# Patient Record
Sex: Female | Born: 1965 | Race: White | Hispanic: No | Marital: Married | Smoking: Current every day smoker
Health system: Southern US, Community
[De-identification: ages and names within clinical notes are randomized; demographics above are authoritative.]

## PROBLEM LIST (undated history)

## (undated) DIAGNOSIS — F419 Anxiety disorder, unspecified: Secondary | ICD-10-CM

## (undated) DIAGNOSIS — K7030 Alcoholic cirrhosis of liver without ascites: Secondary | ICD-10-CM

## (undated) DIAGNOSIS — R188 Other ascites: Secondary | ICD-10-CM

## (undated) DIAGNOSIS — K219 Gastro-esophageal reflux disease without esophagitis: Secondary | ICD-10-CM

## (undated) DIAGNOSIS — I10 Essential (primary) hypertension: Secondary | ICD-10-CM

## (undated) DIAGNOSIS — F329 Major depressive disorder, single episode, unspecified: Secondary | ICD-10-CM

## (undated) DIAGNOSIS — R160 Hepatomegaly, not elsewhere classified: Secondary | ICD-10-CM

## (undated) DIAGNOSIS — F32A Depression, unspecified: Secondary | ICD-10-CM

## (undated) DIAGNOSIS — D696 Thrombocytopenia, unspecified: Secondary | ICD-10-CM

## (undated) DIAGNOSIS — A6000 Herpesviral infection of urogenital system, unspecified: Secondary | ICD-10-CM

## (undated) DIAGNOSIS — E7800 Pure hypercholesterolemia, unspecified: Secondary | ICD-10-CM

## (undated) DIAGNOSIS — F1010 Alcohol abuse, uncomplicated: Secondary | ICD-10-CM

## (undated) DIAGNOSIS — K8020 Calculus of gallbladder without cholecystitis without obstruction: Secondary | ICD-10-CM

---

## 2004-05-31 ENCOUNTER — Encounter: Admission: RE | Admit: 2004-05-31 | Discharge: 2004-05-31 | Payer: Self-pay | Admitting: Specialist

## 2004-08-08 ENCOUNTER — Encounter: Admission: RE | Admit: 2004-08-08 | Discharge: 2004-08-08 | Payer: Self-pay | Admitting: Specialist

## 2007-12-17 ENCOUNTER — Encounter: Admission: RE | Admit: 2007-12-17 | Discharge: 2007-12-17 | Payer: Self-pay | Admitting: Obstetrics and Gynecology

## 2011-03-11 ENCOUNTER — Other Ambulatory Visit: Payer: Self-pay | Admitting: Family Medicine

## 2011-03-11 DIAGNOSIS — R1011 Right upper quadrant pain: Secondary | ICD-10-CM

## 2015-06-26 ENCOUNTER — Other Ambulatory Visit: Payer: Self-pay | Admitting: Family Medicine

## 2015-06-26 DIAGNOSIS — R7989 Other specified abnormal findings of blood chemistry: Secondary | ICD-10-CM

## 2015-06-26 DIAGNOSIS — R945 Abnormal results of liver function studies: Principal | ICD-10-CM

## 2017-06-19 ENCOUNTER — Other Ambulatory Visit: Payer: Self-pay | Admitting: Family Medicine

## 2017-06-19 ENCOUNTER — Ambulatory Visit
Admission: RE | Admit: 2017-06-19 | Discharge: 2017-06-19 | Disposition: A | Discharge status: Home / Self Care | Payer: PRIVATE HEALTH INSURANCE | Source: Ambulatory Visit | Attending: Family Medicine | Admitting: Family Medicine

## 2017-06-19 DIAGNOSIS — R140 Abdominal distension (gaseous): Secondary | ICD-10-CM

## 2017-06-19 DIAGNOSIS — R1900 Intra-abdominal and pelvic swelling, mass and lump, unspecified site: Secondary | ICD-10-CM

## 2017-06-19 MED ORDER — IOPAMIDOL (ISOVUE-300) INJECTION 61%
100.0000 mL | Freq: Once | INTRAVENOUS | Status: AC | PRN
  Administered 2017-06-19: 100 mL via INTRAVENOUS

## 2017-07-01 ENCOUNTER — Other Ambulatory Visit (HOSPITAL_COMMUNITY): Payer: Self-pay | Admitting: Gastroenterology

## 2017-07-01 DIAGNOSIS — R188 Other ascites: Secondary | ICD-10-CM

## 2017-07-03 ENCOUNTER — Encounter (HOSPITAL_COMMUNITY): Payer: Self-pay | Admitting: General Surgery

## 2017-07-03 ENCOUNTER — Ambulatory Visit (HOSPITAL_COMMUNITY)
Admission: RE | Admit: 2017-07-03 | Discharge: 2017-07-03 | Disposition: A | Discharge status: Home / Self Care | Payer: PRIVATE HEALTH INSURANCE | Source: Ambulatory Visit | Attending: Gastroenterology | Admitting: Gastroenterology

## 2017-07-03 DIAGNOSIS — R160 Hepatomegaly, not elsewhere classified: Secondary | ICD-10-CM | POA: Diagnosis not present

## 2017-07-03 DIAGNOSIS — R188 Other ascites: Secondary | ICD-10-CM | POA: Diagnosis present

## 2017-07-03 HISTORY — PX: IR PARACENTESIS: IMG2679

## 2017-07-03 LAB — GRAM STAIN

## 2017-07-03 LAB — BODY FLUID CELL COUNT WITH DIFFERENTIAL
Lymphs, Fluid: 43 %
Monocyte-Macrophage-Serous Fluid: 49 % — ABNORMAL LOW (ref 50–90)
Neutrophil Count, Fluid: 8 % (ref 0–25)
Total Nucleated Cell Count, Fluid: 60 cu mm (ref 0–1000)

## 2017-07-03 LAB — ALBUMIN, PLEURAL OR PERITONEAL FLUID: Albumin, Fluid: <1 g/dL

## 2017-07-03 LAB — PROTEIN, PLEURAL OR PERITONEAL FLUID: Total protein, fluid: <3 g/dL

## 2017-07-03 MED ORDER — LIDOCAINE HCL (PF) 2 % IJ SOLN
INTRAMUSCULAR | Status: DC | PRN
  Administered 2017-07-03: 10 mL

## 2017-07-03 MED ORDER — LIDOCAINE 2% (20 MG/ML) 5 ML SYRINGE
INTRAMUSCULAR | Status: AC
  Filled 2017-07-03: qty 10

## 2017-07-03 NOTE — Procedures (Signed)
Ultrasound-guided diagnostic and therapeutic paracentesis performed yielding 1.3 liters of slightly cloudy yellow colored fluid. No immediate complications.  Victoria Peck 9:59 AM 07/03/2017

## 2017-07-07 LAB — CULTURE, BODY FLUID W GRAM STAIN -BOTTLE

## 2017-07-07 LAB — CULTURE, BODY FLUID-BOTTLE

## 2018-01-15 ENCOUNTER — Other Ambulatory Visit: Payer: Self-pay | Admitting: Physician Assistant

## 2018-01-15 DIAGNOSIS — K7031 Alcoholic cirrhosis of liver with ascites: Secondary | ICD-10-CM

## 2018-01-15 DIAGNOSIS — R160 Hepatomegaly, not elsewhere classified: Secondary | ICD-10-CM

## 2018-01-20 ENCOUNTER — Other Ambulatory Visit: Payer: PRIVATE HEALTH INSURANCE

## 2018-01-21 ENCOUNTER — Ambulatory Visit
Admission: RE | Admit: 2018-01-21 | Discharge: 2018-01-21 | Disposition: A | Discharge status: Home / Self Care | Payer: Self-pay | Source: Ambulatory Visit | Attending: Physician Assistant | Admitting: Physician Assistant

## 2018-01-21 DIAGNOSIS — K7031 Alcoholic cirrhosis of liver with ascites: Secondary | ICD-10-CM

## 2018-01-21 DIAGNOSIS — R160 Hepatomegaly, not elsewhere classified: Secondary | ICD-10-CM

## 2018-01-29 ENCOUNTER — Encounter (HOSPITAL_COMMUNITY): Payer: Self-pay | Admitting: Emergency Medicine

## 2018-01-29 ENCOUNTER — Emergency Department (HOSPITAL_COMMUNITY)
Admission: EM | Admit: 2018-01-29 | Discharge: 2018-01-30 | Discharge status: Against Medical Advice | Payer: PRIVATE HEALTH INSURANCE | Attending: Emergency Medicine | Admitting: Emergency Medicine

## 2018-01-29 ENCOUNTER — Other Ambulatory Visit: Payer: Self-pay

## 2018-01-29 DIAGNOSIS — K7031 Alcoholic cirrhosis of liver with ascites: Secondary | ICD-10-CM | POA: Insufficient documentation

## 2018-01-29 DIAGNOSIS — R109 Unspecified abdominal pain: Secondary | ICD-10-CM

## 2018-01-29 DIAGNOSIS — F17210 Nicotine dependence, cigarettes, uncomplicated: Secondary | ICD-10-CM | POA: Insufficient documentation

## 2018-01-29 DIAGNOSIS — I10 Essential (primary) hypertension: Secondary | ICD-10-CM | POA: Insufficient documentation

## 2018-01-29 HISTORY — DX: Hepatomegaly, not elsewhere classified: R16.0

## 2018-01-29 HISTORY — DX: Herpesviral infection of urogenital system, unspecified: A60.00

## 2018-01-29 HISTORY — DX: Other ascites: R18.8

## 2018-01-29 HISTORY — DX: Pure hypercholesterolemia, unspecified: E78.00

## 2018-01-29 HISTORY — DX: Essential (primary) hypertension: I10

## 2018-01-29 HISTORY — DX: Alcohol abuse, uncomplicated: F10.10

## 2018-01-29 HISTORY — DX: Major depressive disorder, single episode, unspecified: F32.9

## 2018-01-29 HISTORY — DX: Alcoholic cirrhosis of liver without ascites: K70.30

## 2018-01-29 HISTORY — DX: Gastro-esophageal reflux disease without esophagitis: K21.9

## 2018-01-29 HISTORY — DX: Thrombocytopenia, unspecified: D69.6

## 2018-01-29 HISTORY — DX: Calculus of gallbladder without cholecystitis without obstruction: K80.20

## 2018-01-29 HISTORY — DX: Anxiety disorder, unspecified: F41.9

## 2018-01-29 HISTORY — DX: Depression, unspecified: F32.A

## 2018-01-29 LAB — COMPREHENSIVE METABOLIC PANEL
ALT: 73 U/L — ABNORMAL HIGH (ref 0–44)
AST: 108 U/L — ABNORMAL HIGH (ref 15–41)
Albumin: 3.3 g/dL — ABNORMAL LOW (ref 3.5–5.0)
Alkaline Phosphatase: 171 U/L — ABNORMAL HIGH (ref 38–126)
Anion gap: 9 (ref 5–15)
BUN: <5 mg/dL — ABNORMAL LOW (ref 6–20)
CO2: 24 mmol/L (ref 22–32)
Calcium: 9.1 mg/dL (ref 8.9–10.3)
Chloride: 105 mmol/L (ref 98–111)
Creatinine, Ser: 0.43 mg/dL — ABNORMAL LOW (ref 0.44–1.00)
GFR calc Af Amer: >60 mL/min (ref >60)
GFR calc non Af Amer: >60 mL/min (ref >60)
Glucose, Bld: 104 mg/dL — ABNORMAL HIGH (ref 70–99)
Potassium: 3.5 mmol/L (ref 3.5–5.1)
Sodium: 138 mmol/L (ref 135–145)
Total Bilirubin: 2 mg/dL — ABNORMAL HIGH (ref 0.3–1.2)
Total Protein: 6.3 g/dL — ABNORMAL LOW (ref 6.5–8.1)

## 2018-01-29 LAB — CBC
HCT: 37.5 % (ref 36.0–46.0)
Hemoglobin: 12.5 g/dL (ref 12.0–15.0)
MCH: 34.9 pg — ABNORMAL HIGH (ref 26.0–34.0)
MCHC: 33.3 g/dL (ref 30.0–36.0)
MCV: 104.7 fL — ABNORMAL HIGH (ref 78.0–100.0)
Platelets: 188 10*3/uL (ref 150–400)
RBC: 3.58 MIL/uL — ABNORMAL LOW (ref 3.87–5.11)
RDW: 12 % (ref 11.5–15.5)
WBC: 10.9 10*3/uL — ABNORMAL HIGH (ref 4.0–10.5)

## 2018-01-29 LAB — URINALYSIS, ROUTINE W REFLEX MICROSCOPIC
Bilirubin Urine: NEGATIVE
Glucose, UA: NEGATIVE mg/dL
Hgb urine dipstick: NEGATIVE
Ketones, ur: NEGATIVE mg/dL
Leukocytes, UA: NEGATIVE
Nitrite: NEGATIVE
Protein, ur: NEGATIVE mg/dL
Specific Gravity, Urine: 1.023 (ref 1.005–1.030)
pH: 5 (ref 5.0–8.0)

## 2018-01-29 LAB — LIPASE, BLOOD: Lipase: 50 U/L (ref 11–51)

## 2018-01-29 NOTE — ED Notes (Signed)
[facility], sending pt to ED for further evaluation of abd pain/distention/cramping, ?cirrhosis

## 2018-01-29 NOTE — ED Provider Notes (Signed)
Patient placed in Quick Look pathway, seen and evaluated   Chief Complaint: abdominal pain  HPI:   Victoria Peck is a 52 y.o. female who [facility] evaluation for abdominal pain, distention and cramping. Patient has had ongoing problems for several months and had had gall stones and liver problems associated with alcohol use.  ROS: GI; abdominal pain, nausea and vomiting in the mornings  Physical Exam:  BP 122/75   Pulse 91   Temp 98.8 F (37.1 C) (Oral)   Resp 16   SpO2 100%    Gen: No distress  Neuro: Awake and Alert  Skin: Warm and dry   Initiation of care has begun. The patient has been counseled on the process, plan, and necessity for staying for the completion/evaluation, and the remainder of the medical screening examination    Victoria Peck, [name], NP 01/29/18 2046    Azalia Bilisampos, Kevin, MD 01/30/18 [phone]

## 2018-01-29 NOTE — ED Triage Notes (Signed)
[facility] for abd distention, bloating, and generalized abd cramping x 1 week.  Pt has history of alcoholic cirrhosis.  Reports vomiting in the mornings and diarrhea x 2 months. Reports she had ultrasound last week that showed gallstones.

## 2018-01-30 NOTE — ED Provider Notes (Signed)
[facility] EMERGENCY DEPARTMENT Provider Note   CSN: [id number] Arrival date & time: 01/29/18  2026     History   Chief Complaint Chief Complaint  Patient presents with  . Abdominal Pain    HPI Ranesha Val is a 52 y.o. female.  Patient is a 52 year old female with past medical history of alcoholic cirrhosis which has recently been diagnosed.  She presents today for evaluation of abdominal discomfort.  She reports abdominal pain and bloating that has worsened over the past several days.  She denies vomiting or diarrhea.  She was seen at [facility], then sent here for further evaluation.  She had a fever there of 100.6, is afebrile now.  The history is provided by the patient.  Abdominal Pain   This is a new problem. Episode onset: 3 days ago. The problem occurs constantly. The problem has been gradually worsening. Associated with: Alcoholic cirrhosis. The pain is located in the generalized abdominal region. The quality of the pain is cramping. The pain is moderate. Associated symptoms include fever. Pertinent negatives include constipation and dysuria. Nothing aggravates the symptoms. Nothing relieves the symptoms.    Past Medical History:  Diagnosis Date  . Alcohol abuse   . Alcoholic cirrhosis (HCC)   . Anxiety   . Ascites of liver   . Depression   . Gallstones   . Genital herpes   . GERD (gastroesophageal reflux disease)   . Hepatomegaly   . High cholesterol   . Hypertension   . Thrombocytopenia (HCC)     There are no active problems to display for this patient.   Past Surgical History:  Procedure Laterality Date  . IR PARACENTESIS  07/03/2017     OB History   None      Home Medications    Prior to Admission medications   Not on File    Family History No family history on file.  Social History Social History   Tobacco Use  . Smoking status: Current Every Day Smoker  . Smokeless tobacco: Never Used  Substance  Use Topics  . Alcohol use: Yes  . Drug use: Not Currently     Allergies   Patient has no allergy information on record.   Review of Systems Review of Systems  Constitutional: Positive for fever.  Gastrointestinal: Positive for abdominal pain. Negative for constipation.  Genitourinary: Negative for dysuria.  All other systems reviewed and are negative.    Physical Exam Updated Vital Signs BP 111/71 (BP Location: Right Arm)   Pulse 94   Temp 98.8 F (37.1 C) (Oral)   Resp 16   SpO2 98%   Physical Exam  Constitutional: She is oriented to person, place, and time. She appears well-developed and well-nourished. No distress.  HENT:  Head: Normocephalic and atraumatic.  Neck: Normal range of motion. Neck supple.  Cardiovascular: Normal rate and regular rhythm. Exam reveals no gallop and no friction rub.  No murmur heard. Pulmonary/Chest: Effort normal and breath sounds normal. No respiratory distress. She has no wheezes.  Abdominal: Soft. Bowel sounds are normal. She exhibits no distension. There is tenderness.  Abdomen is distended with ascites/fluid wave.  There is generalized tenderness, however no rebound or guarding.  Musculoskeletal: Normal range of motion.  Neurological: She is alert and oriented to person, place, and time.  Skin: Skin is warm and dry. She is not diaphoretic.  Nursing note and vitals reviewed.    ED Treatments / Results  Labs (all  labs ordered are listed, but only abnormal results are displayed) Labs Reviewed  COMPREHENSIVE METABOLIC PANEL - Abnormal; Notable for the following components:      Result Value   Glucose, Bld 104 (*)    BUN <5 (*)    Creatinine, Ser 0.43 (*)    Total Protein 6.3 (*)    Albumin 3.3 (*)    AST 108 (*)    ALT 73 (*)    Alkaline Phosphatase 171 (*)    Total Bilirubin 2.0 (*)    All other components within normal limits  CBC - Abnormal; Notable for the following components:   WBC 10.9 (*)    RBC 3.58 (*)    MCV  104.7 (*)    MCH 34.9 (*)    All other components within normal limits  URINALYSIS, ROUTINE W REFLEX MICROSCOPIC - Abnormal; Notable for the following components:   Color, Urine AMBER (*)    APPearance HAZY (*)    Bacteria, UA RARE (*)    All other components within normal limits  LIPASE, BLOOD    EKG None  Radiology No results found.  Procedures Procedures (including critical care time)  Medications Ordered in ED Medications - No data to display   Initial Impression / Assessment and Plan / ED Course  I have reviewed the triage vital signs and the nursing notes.  Pertinent labs & imaging results that were available during my care of the patient were reviewed by me and considered in my medical decision making (see chart for details).  Patient with history of newly diagnosed alcohol related cirrhosis presenting with abdominal distention and abdominal discomfort.  She was found to be febrile with a temp of 100.6 at the doctor's office, then was sent here for further evaluation.  My initial concerns were for spontaneous bacterial peritonitis and had plan to admit the patient to the hospitalist service.  Patient became upset with waiting, then decided she no longer wanted to be in the ER.  I advised her that this was not in her best interest and that not staying in the hospital could lead to bad outcomes such as sepsis or death.  She was willing to accept these risks, and insisted upon leaving.  She was then discharged at her request.    Final Clinical Impressions(s) / ED Diagnoses   Final diagnoses:  None    ED Discharge Orders    None       Geoffery Lyonselo, Lorrin Nawrot, MD 01/30/18 [phone]

## 2018-01-30 NOTE — Discharge Instructions (Addendum)
You are advised to stay in the hospital for possible spontaneous bacterial peritonitis, however you have declined.  Be sure to follow-up with your gastroenterologist as soon as possible, and return to the emergency department if you develop worsening pain, high fevers, worsening swelling, difficulty breathing, or other new and concerning symptoms.

## 2018-01-30 NOTE — ED Notes (Signed)
WENT IN TO SPEAK WITH PT AND SHE IS NOT WANTING TO STAY AND DOES NOT WANT ME TO PUT AN IV IN HER ARM. She stated she was just going to go to her dr next week and follow up with them.

## 2018-01-30 NOTE — ED Notes (Signed)
Pt was advised to stay but did not want to stay nor be treated.

## 2018-06-15 ENCOUNTER — Other Ambulatory Visit: Payer: Self-pay | Admitting: Physician Assistant

## 2018-06-15 DIAGNOSIS — H9120 Sudden idiopathic hearing loss, unspecified ear: Secondary | ICD-10-CM

## 2018-06-15 DIAGNOSIS — H9312 Tinnitus, left ear: Secondary | ICD-10-CM

## 2018-08-27 ENCOUNTER — Other Ambulatory Visit: Payer: Self-pay | Admitting: Gastroenterology

## 2018-08-30 ENCOUNTER — Other Ambulatory Visit: Payer: Self-pay | Admitting: Gastroenterology

## 2018-08-30 DIAGNOSIS — R945 Abnormal results of liver function studies: Secondary | ICD-10-CM

## 2018-08-30 DIAGNOSIS — R7989 Other specified abnormal findings of blood chemistry: Secondary | ICD-10-CM

## 2018-08-30 DIAGNOSIS — K7460 Unspecified cirrhosis of liver: Secondary | ICD-10-CM

## 2019-08-18 ENCOUNTER — Inpatient Hospital Stay (HOSPITAL_COMMUNITY)
Admission: EM | Admit: 2019-08-18 | Discharge: 2019-09-01 | DRG: 432 | Disposition: E | Payer: PRIVATE HEALTH INSURANCE | Attending: Emergency Medicine | Admitting: Emergency Medicine

## 2019-08-18 ENCOUNTER — Emergency Department (HOSPITAL_COMMUNITY): Payer: Self-pay

## 2019-08-18 ENCOUNTER — Encounter (HOSPITAL_COMMUNITY): Payer: Self-pay | Admitting: Emergency Medicine

## 2019-08-18 DIAGNOSIS — T17998A Other foreign object in respiratory tract, part unspecified causing other injury, initial encounter: Secondary | ICD-10-CM | POA: Diagnosis present

## 2019-08-18 DIAGNOSIS — Z9289 Personal history of other medical treatment: Secondary | ICD-10-CM

## 2019-08-18 DIAGNOSIS — K7011 Alcoholic hepatitis with ascites: Secondary | ICD-10-CM | POA: Diagnosis present

## 2019-08-18 DIAGNOSIS — R4182 Altered mental status, unspecified: Secondary | ICD-10-CM

## 2019-08-18 DIAGNOSIS — F1020 Alcohol dependence, uncomplicated: Secondary | ICD-10-CM | POA: Diagnosis present

## 2019-08-18 DIAGNOSIS — R161 Splenomegaly, not elsewhere classified: Secondary | ICD-10-CM | POA: Diagnosis present

## 2019-08-18 DIAGNOSIS — J80 Acute respiratory distress syndrome: Secondary | ICD-10-CM | POA: Diagnosis not present

## 2019-08-18 DIAGNOSIS — F419 Anxiety disorder, unspecified: Secondary | ICD-10-CM | POA: Diagnosis present

## 2019-08-18 DIAGNOSIS — K8020 Calculus of gallbladder without cholecystitis without obstruction: Secondary | ICD-10-CM | POA: Diagnosis present

## 2019-08-18 DIAGNOSIS — K7682 Hepatic encephalopathy: Secondary | ICD-10-CM | POA: Diagnosis present

## 2019-08-18 DIAGNOSIS — D689 Coagulation defect, unspecified: Secondary | ICD-10-CM | POA: Diagnosis present

## 2019-08-18 DIAGNOSIS — Z515 Encounter for palliative care: Secondary | ICD-10-CM | POA: Diagnosis not present

## 2019-08-18 DIAGNOSIS — Z01818 Encounter for other preprocedural examination: Secondary | ICD-10-CM

## 2019-08-18 DIAGNOSIS — K7040 Alcoholic hepatic failure without coma: Secondary | ICD-10-CM | POA: Diagnosis present

## 2019-08-18 DIAGNOSIS — W449XXA Unspecified foreign body entering into or through a natural orifice, initial encounter: Secondary | ICD-10-CM

## 2019-08-18 DIAGNOSIS — K3189 Other diseases of stomach and duodenum: Secondary | ICD-10-CM | POA: Diagnosis present

## 2019-08-18 DIAGNOSIS — E7220 Disorder of urea cycle metabolism, unspecified: Secondary | ICD-10-CM

## 2019-08-18 DIAGNOSIS — K7031 Alcoholic cirrhosis of liver with ascites: Principal | ICD-10-CM | POA: Diagnosis present

## 2019-08-18 DIAGNOSIS — K920 Hematemesis: Secondary | ICD-10-CM

## 2019-08-18 DIAGNOSIS — F329 Major depressive disorder, single episode, unspecified: Secondary | ICD-10-CM | POA: Diagnosis present

## 2019-08-18 DIAGNOSIS — Z79899 Other long term (current) drug therapy: Secondary | ICD-10-CM

## 2019-08-18 DIAGNOSIS — R296 Repeated falls: Secondary | ICD-10-CM | POA: Diagnosis present

## 2019-08-18 DIAGNOSIS — F17200 Nicotine dependence, unspecified, uncomplicated: Secondary | ICD-10-CM | POA: Diagnosis present

## 2019-08-18 DIAGNOSIS — Z9981 Dependence on supplemental oxygen: Secondary | ICD-10-CM

## 2019-08-18 DIAGNOSIS — Z452 Encounter for adjustment and management of vascular access device: Secondary | ICD-10-CM

## 2019-08-18 DIAGNOSIS — K766 Portal hypertension: Secondary | ICD-10-CM | POA: Diagnosis present

## 2019-08-18 DIAGNOSIS — I959 Hypotension, unspecified: Secondary | ICD-10-CM | POA: Diagnosis present

## 2019-08-18 DIAGNOSIS — Z20822 Contact with and (suspected) exposure to covid-19: Secondary | ICD-10-CM | POA: Diagnosis present

## 2019-08-18 DIAGNOSIS — A419 Sepsis, unspecified organism: Secondary | ICD-10-CM | POA: Diagnosis not present

## 2019-08-18 DIAGNOSIS — E872 Acidosis, unspecified: Secondary | ICD-10-CM | POA: Diagnosis not present

## 2019-08-18 DIAGNOSIS — D62 Acute posthemorrhagic anemia: Secondary | ICD-10-CM | POA: Diagnosis present

## 2019-08-18 DIAGNOSIS — A6000 Herpesviral infection of urogenital system, unspecified: Secondary | ICD-10-CM | POA: Diagnosis present

## 2019-08-18 DIAGNOSIS — Z9911 Dependence on respirator [ventilator] status: Secondary | ICD-10-CM

## 2019-08-18 DIAGNOSIS — D500 Iron deficiency anemia secondary to blood loss (chronic): Secondary | ICD-10-CM

## 2019-08-18 DIAGNOSIS — Z4659 Encounter for fitting and adjustment of other gastrointestinal appliance and device: Secondary | ICD-10-CM

## 2019-08-18 DIAGNOSIS — K7030 Alcoholic cirrhosis of liver without ascites: Secondary | ICD-10-CM | POA: Diagnosis present

## 2019-08-18 DIAGNOSIS — I10 Essential (primary) hypertension: Secondary | ICD-10-CM | POA: Diagnosis present

## 2019-08-18 DIAGNOSIS — G92 Toxic encephalopathy: Secondary | ICD-10-CM | POA: Diagnosis present

## 2019-08-18 DIAGNOSIS — Z66 Do not resuscitate: Secondary | ICD-10-CM | POA: Diagnosis not present

## 2019-08-18 DIAGNOSIS — R451 Restlessness and agitation: Secondary | ICD-10-CM | POA: Diagnosis not present

## 2019-08-18 DIAGNOSIS — G928 Other toxic encephalopathy: Secondary | ICD-10-CM | POA: Diagnosis not present

## 2019-08-18 DIAGNOSIS — K7290 Hepatic failure, unspecified without coma: Secondary | ICD-10-CM | POA: Diagnosis present

## 2019-08-18 DIAGNOSIS — N179 Acute kidney failure, unspecified: Secondary | ICD-10-CM

## 2019-08-18 DIAGNOSIS — J9600 Acute respiratory failure, unspecified whether with hypoxia or hypercapnia: Secondary | ICD-10-CM

## 2019-08-18 DIAGNOSIS — R0902 Hypoxemia: Secondary | ICD-10-CM

## 2019-08-18 DIAGNOSIS — I781 Nevus, non-neoplastic: Secondary | ICD-10-CM | POA: Diagnosis present

## 2019-08-18 DIAGNOSIS — J690 Pneumonitis due to inhalation of food and vomit: Secondary | ICD-10-CM | POA: Diagnosis not present

## 2019-08-18 DIAGNOSIS — D696 Thrombocytopenia, unspecified: Secondary | ICD-10-CM | POA: Diagnosis present

## 2019-08-18 DIAGNOSIS — R6521 Severe sepsis with septic shock: Secondary | ICD-10-CM | POA: Diagnosis not present

## 2019-08-18 DIAGNOSIS — R579 Shock, unspecified: Secondary | ICD-10-CM

## 2019-08-18 DIAGNOSIS — K7200 Acute and subacute hepatic failure without coma: Secondary | ICD-10-CM | POA: Diagnosis present

## 2019-08-18 DIAGNOSIS — K219 Gastro-esophageal reflux disease without esophagitis: Secondary | ICD-10-CM | POA: Diagnosis present

## 2019-08-18 DIAGNOSIS — K767 Hepatorenal syndrome: Secondary | ICD-10-CM | POA: Diagnosis not present

## 2019-08-18 DIAGNOSIS — T17908A Unspecified foreign body in respiratory tract, part unspecified causing other injury, initial encounter: Secondary | ICD-10-CM

## 2019-08-18 DIAGNOSIS — R34 Anuria and oliguria: Secondary | ICD-10-CM | POA: Diagnosis not present

## 2019-08-18 DIAGNOSIS — E7800 Pure hypercholesterolemia, unspecified: Secondary | ICD-10-CM | POA: Diagnosis present

## 2019-08-18 LAB — AMMONIA
Ammonia: 280 umol/L — ABNORMAL HIGH (ref 9–35)
Ammonia: 162 umol/L — ABNORMAL HIGH (ref 9–35)

## 2019-08-18 LAB — CBC
HCT: 13.7 % — ABNORMAL LOW (ref 36.0–46.0)
HCT: 17.7 % — ABNORMAL LOW (ref 36.0–46.0)
Hemoglobin: 4.6 g/dL — CL (ref 12.0–15.0)
Hemoglobin: 6.1 g/dL — CL (ref 12.0–15.0)
MCH: 39.7 pg — ABNORMAL HIGH (ref 26.0–34.0)
MCH: 34.1 pg — ABNORMAL HIGH (ref 26.0–34.0)
MCHC: 33.6 g/dL (ref 30.0–36.0)
MCHC: 34.5 g/dL (ref 30.0–36.0)
MCV: 118.1 fL — ABNORMAL HIGH (ref 80.0–100.0)
MCV: 98.9 fL (ref 80.0–100.0)
Platelets: 54 10*3/uL — ABNORMAL LOW (ref 150–400)
Platelets: 40 K/uL — ABNORMAL LOW (ref 150–400)
RBC: 1.16 MIL/uL — ABNORMAL LOW (ref 3.87–5.11)
RBC: 1.79 MIL/uL — ABNORMAL LOW (ref 3.87–5.11)
RDW: 14.6 % (ref 11.5–15.5)
RDW: 21.5 % — ABNORMAL HIGH (ref 11.5–15.5)
WBC: 6.4 10*3/uL (ref 4.0–10.5)
WBC: 4.7 K/uL (ref 4.0–10.5)
nRBC: 0 % (ref 0.0–0.2)
nRBC: 0.4 % — ABNORMAL HIGH (ref 0.0–0.2)

## 2019-08-18 LAB — HEPATIC FUNCTION PANEL
ALT: 45 U/L — ABNORMAL HIGH (ref 0–44)
AST: 88 U/L — ABNORMAL HIGH (ref 15–41)
Albumin: 2.4 g/dL — ABNORMAL LOW (ref 3.5–5.0)
Alkaline Phosphatase: 84 U/L (ref 38–126)
Bilirubin, Direct: 2.1 mg/dL — ABNORMAL HIGH (ref 0.0–0.2)
Indirect Bilirubin: 2.6 mg/dL — ABNORMAL HIGH (ref 0.3–0.9)
Total Bilirubin: 4.7 mg/dL — ABNORMAL HIGH (ref 0.3–1.2)
Total Protein: 4.7 g/dL — ABNORMAL LOW (ref 6.5–8.1)

## 2019-08-18 LAB — ABO/RH: ABO/RH(D): A POS

## 2019-08-18 LAB — PROTIME-INR
INR: 2.7 — ABNORMAL HIGH (ref 0.8–1.2)
INR: 2.4 — ABNORMAL HIGH (ref 0.8–1.2)
Prothrombin Time: 28.5 seconds — ABNORMAL HIGH (ref 11.4–15.2)
Prothrombin Time: 26.3 s — ABNORMAL HIGH (ref 11.4–15.2)

## 2019-08-18 LAB — BASIC METABOLIC PANEL
Anion gap: 14 (ref 5–15)
BUN: 43 mg/dL — ABNORMAL HIGH (ref 6–20)
CO2: 13 mmol/L — ABNORMAL LOW (ref 22–32)
Calcium: 8.4 mg/dL — ABNORMAL LOW (ref 8.9–10.3)
Chloride: 113 mmol/L — ABNORMAL HIGH (ref 98–111)
Creatinine, Ser: 2.26 mg/dL — ABNORMAL HIGH (ref 0.44–1.00)
GFR calc Af Amer: 28 mL/min — ABNORMAL LOW (ref >60)
GFR calc non Af Amer: 24 mL/min — ABNORMAL LOW (ref >60)
Glucose, Bld: 114 mg/dL — ABNORMAL HIGH (ref 70–99)
Potassium: 4.1 mmol/L (ref 3.5–5.1)
Sodium: 140 mmol/L (ref 135–145)

## 2019-08-18 LAB — MAGNESIUM: Magnesium: 1.8 mg/dL (ref 1.7–2.4)

## 2019-08-18 LAB — CBG MONITORING, ED: Glucose-Capillary: 102 mg/dL — ABNORMAL HIGH (ref 70–99)

## 2019-08-18 LAB — PREPARE RBC (CROSSMATCH)

## 2019-08-18 LAB — RESPIRATORY PANEL BY RT PCR (FLU A&B, COVID)
Influenza A by PCR: NEGATIVE
Influenza B by PCR: NEGATIVE
SARS Coronavirus 2 by RT PCR: NEGATIVE

## 2019-08-18 LAB — COMPREHENSIVE METABOLIC PANEL WITH GFR
ALT: 41 U/L (ref 0–44)
AST: 71 U/L — ABNORMAL HIGH (ref 15–41)
Albumin: 2.6 g/dL — ABNORMAL LOW (ref 3.5–5.0)
Alkaline Phosphatase: 70 U/L (ref 38–126)
Anion gap: 9 (ref 5–15)
BUN: 38 mg/dL — ABNORMAL HIGH (ref 6–20)
CO2: 16 mmol/L — ABNORMAL LOW (ref 22–32)
Calcium: 7.5 mg/dL — ABNORMAL LOW (ref 8.9–10.3)
Chloride: 117 mmol/L — ABNORMAL HIGH (ref 98–111)
Creatinine, Ser: 1.93 mg/dL — ABNORMAL HIGH (ref 0.44–1.00)
GFR calc Af Amer: 34 mL/min — ABNORMAL LOW
GFR calc non Af Amer: 29 mL/min — ABNORMAL LOW
Glucose, Bld: 111 mg/dL — ABNORMAL HIGH (ref 70–99)
Potassium: 4 mmol/L (ref 3.5–5.1)
Sodium: 142 mmol/L (ref 135–145)
Total Bilirubin: 4.5 mg/dL — ABNORMAL HIGH (ref 0.3–1.2)
Total Protein: 4.7 g/dL — ABNORMAL LOW (ref 6.5–8.1)

## 2019-08-18 LAB — HEMOGLOBIN AND HEMATOCRIT, BLOOD
HCT: 25.7 % — ABNORMAL LOW (ref 36.0–46.0)
HCT: 26.1 % — ABNORMAL LOW (ref 36.0–46.0)
Hemoglobin: 9 g/dL — ABNORMAL LOW (ref 12.0–15.0)
Hemoglobin: 9.1 g/dL — ABNORMAL LOW (ref 12.0–15.0)

## 2019-08-18 LAB — GLUCOSE, CAPILLARY
Glucose-Capillary: 113 mg/dL — ABNORMAL HIGH (ref 70–99)
Glucose-Capillary: 112 mg/dL — ABNORMAL HIGH (ref 70–99)

## 2019-08-18 LAB — I-STAT BETA HCG BLOOD, ED (MC, WL, AP ONLY): I-stat hCG, quantitative: <5 m[IU]/mL (ref <5)

## 2019-08-18 LAB — SARS CORONAVIRUS 2 (TAT 6-24 HRS): SARS Coronavirus 2: NEGATIVE

## 2019-08-18 LAB — LIPASE, BLOOD: Lipase: 79 U/L — ABNORMAL HIGH (ref 11–51)

## 2019-08-18 LAB — HIV ANTIBODY (ROUTINE TESTING W REFLEX): HIV Screen 4th Generation wRfx: NONREACTIVE

## 2019-08-18 LAB — MRSA PCR SCREENING: MRSA by PCR: NEGATIVE

## 2019-08-18 MED ORDER — ALBUMIN HUMAN 5 % IV SOLN
12.5000 g | Freq: Once | INTRAVENOUS | Status: AC
  Administered 2019-08-18: 02:00:00 12.5 g via INTRAVENOUS
  Filled 2019-08-18: qty 250

## 2019-08-18 MED ORDER — LACTULOSE 10 GM/15ML PO SOLN
10.0000 g | Freq: Two times a day (BID) | ORAL | Status: DC
  Administered 2019-08-18 (×2): 10 g via ORAL
  Filled 2019-08-18 (×2): qty 15

## 2019-08-18 MED ORDER — SODIUM CHLORIDE 0.9 % IV SOLN
1000.0000 mL | INTRAVENOUS | Status: DC
  Administered 2019-08-18: 1000 mL via INTRAVENOUS

## 2019-08-18 MED ORDER — SODIUM CHLORIDE 0.9 % IV BOLUS (SEPSIS)
1000.0000 mL | Freq: Once | INTRAVENOUS | Status: AC
  Administered 2019-08-18: 01:00:00 1000 mL via INTRAVENOUS

## 2019-08-18 MED ORDER — PANTOPRAZOLE SODIUM 40 MG IV SOLR
40.0000 mg | Freq: Once | INTRAVENOUS | Status: AC
  Administered 2019-08-18: 40 mg via INTRAVENOUS
  Filled 2019-08-18: qty 40

## 2019-08-18 MED ORDER — CHLORHEXIDINE GLUCONATE CLOTH 2 % EX PADS
6.0000 | MEDICATED_PAD | Freq: Every day | CUTANEOUS | Status: DC
  Administered 2019-08-18 – 2019-08-23 (×6): 6 via TOPICAL

## 2019-08-18 MED ORDER — SODIUM CHLORIDE 0.9 % IV BOLUS (SEPSIS)
1000.0000 mL | Freq: Once | INTRAVENOUS | Status: AC
  Administered 2019-08-18: 1000 mL via INTRAVENOUS

## 2019-08-18 MED ORDER — OCTREOTIDE LOAD VIA INFUSION
25.0000 ug | Freq: Once | INTRAVENOUS | Status: AC
  Administered 2019-08-18: 25 ug via INTRAVENOUS
  Filled 2019-08-18: qty 13

## 2019-08-18 MED ORDER — SODIUM CHLORIDE 0.9 % IV SOLN
50.0000 ug/h | INTRAVENOUS | Status: DC
  Administered 2019-08-18 – 2019-08-20 (×6): 50 ug/h via INTRAVENOUS
  Filled 2019-08-18 (×7): qty 1

## 2019-08-18 MED ORDER — SODIUM CHLORIDE 0.9 % IV SOLN
1000.0000 mL | INTRAVENOUS | Status: DC
  Administered 2019-08-18 (×2): 1000 mL via INTRAVENOUS

## 2019-08-18 MED ORDER — PANTOPRAZOLE SODIUM 40 MG IV SOLR
40.0000 mg | Freq: Two times a day (BID) | INTRAVENOUS | Status: DC
  Administered 2019-08-18 – 2019-08-24 (×13): 40 mg via INTRAVENOUS
  Filled 2019-08-18 (×13): qty 40

## 2019-08-18 MED ORDER — ONDANSETRON HCL 4 MG/2ML IJ SOLN
4.0000 mg | Freq: Once | INTRAMUSCULAR | Status: AC
  Administered 2019-08-18: 01:00:00 4 mg via INTRAVENOUS
  Filled 2019-08-18: qty 2

## 2019-08-18 MED ORDER — SODIUM CHLORIDE 0.9 % IV SOLN
1.0000 g | INTRAVENOUS | Status: DC
Start: 2019-08-19 — End: 2019-08-21
  Administered 2019-08-19 – 2019-08-21 (×3): 1 g via INTRAVENOUS
  Filled 2019-08-18: qty 1
  Filled 2019-08-18 (×5): qty 10

## 2019-08-18 MED ORDER — FENTANYL CITRATE (PF) 100 MCG/2ML IJ SOLN: 50.0000 ug | Freq: Once | INTRAMUSCULAR | Status: DC

## 2019-08-18 MED ORDER — SODIUM CHLORIDE 0.9% IV SOLUTION: Freq: Once | INTRAVENOUS | Status: DC

## 2019-08-18 MED ORDER — ONDANSETRON HCL 4 MG/2ML IJ SOLN
4.0000 mg | Freq: Once | INTRAMUSCULAR | Status: AC
  Administered 2019-08-18: 4 mg via INTRAVENOUS
  Filled 2019-08-18: qty 2

## 2019-08-18 MED ORDER — SODIUM CHLORIDE 0.9% FLUSH: 3.0000 mL | Freq: Once | INTRAVENOUS | Status: DC

## 2019-08-18 MED ORDER — SODIUM CHLORIDE 0.9 % IV SOLN
1.0000 g | Freq: Once | INTRAVENOUS | Status: AC
  Administered 2019-08-18: 1 g via INTRAVENOUS
  Filled 2019-08-18: qty 10

## 2019-08-18 MED ORDER — HYDROCODONE-ACETAMINOPHEN 5-325 MG PO TABS: 1.0000 | ORAL_TABLET | Freq: Once | ORAL | Status: DC

## 2019-08-18 MED ORDER — VITAMIN K1 10 MG/ML IJ SOLN
1.0000 mg | Freq: Once | INTRAVENOUS | Status: AC
  Administered 2019-08-18: 1 mg via INTRAVENOUS
  Filled 2019-08-18: qty 0.1
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
## 2019-08-18 NOTE — Progress Notes (Signed)
eLink Physician-Brief Progress Note Patient Name: Victoria Peck DOB: February 02, 1966 MRN: [id number]   Date of Service  09/06/19  HPI/Events of Note  Pt with history of ETOH liver disease and Cirrhosis admitted last night with hepatic encephalopathy, GI bleeding and acute blood loss anemia with hemoglobin of 4.6 gm %.  eICU Interventions  New Patient Evaluation completed.        Thomasene Lot Randy Castrejon 2019-09-06, 6:20 AM

## 2019-08-18 NOTE — ED Notes (Signed)
MD informed of low HGB 

## 2019-08-18 NOTE — ED Notes (Signed)
Pt transported to CT, this RN to accompany

## 2019-08-18 NOTE — Consult Note (Signed)
[facility] Consultation Note  Referring Provider:  PCCM Primary Care Physician:  Ileana Ladd, MD Primary Gastroenterologist:  Dr. Levora Angel  Reason for Consultation:  weakness  HPI: Victoria Peck is a 54 y.o. female presents to ED with complaints of weakness over the past several days.  Patient has one episode of hematemesis yesterday, unclear amount or consistency, and none since.  No bowel movements or active bleeding since admission.  Upon arrival, she had SBP around 80s, normal HR.  She has been intermittently confused.  No abdominal pain.  No fevers.  No prior GI bleeding and no prior endoscopy.  Upon arrival, Hgb 4.7 and INR 2.7.  Patient with history of alcohol use, last drink one week ago.   Past Medical History:  Diagnosis Date  . Alcohol abuse   . Alcoholic cirrhosis (HCC)   . Anxiety   . Ascites of liver   . Depression   . Gallstones   . Genital herpes   . GERD (gastroesophageal reflux disease)   . Hepatomegaly   . High cholesterol   . Hypertension   . Thrombocytopenia (HCC)     Past Surgical History:  Procedure Laterality Date  . IR PARACENTESIS  07/03/2017    Prior to Admission medications   Medication Sig Start Date End Date Taking? Authorizing Provider  acyclovir (ZOVIRAX) 400 MG tablet Take as directed   Yes [provider]  ALPRAZolam (XANAX) 0.25 MG tablet Take 0.25 mg by mouth 3 (three) times daily as needed for anxiety. 07/01/19  Yes [provider]  furosemide (LASIX) 20 MG tablet Take 20 mg by mouth daily.   Yes [provider]  losartan (COZAAR) 25 MG tablet Take 25 mg by mouth daily.   Yes [provider]  Multiple Vitamin (MULTIVITAMIN WITH MINERALS) TABS tablet Take 1 tablet by mouth daily.   Yes [provider]  sertraline (ZOLOFT) 100 MG tablet Take 100 mg by mouth daily.   Yes [provider]  spironolactone (ALDACTONE) 50 MG tablet Take 50 mg by mouth daily. 08/09/19  Yes  [provider]    Current Facility-Administered Medications  Medication Dose Route Frequency Provider Last Rate Last Admin  . 0.9 %  sodium chloride infusion (Manually program via Guardrails IV Fluids)   Intravenous Once Cardama, Amadeo Garnet, MD      . 0.9 %  sodium chloride infusion  1,000 mL Intravenous Continuous Cardama, Amadeo Garnet, MD 125 mL/hr at 09-17-19 0841 1,000 mL at 09-17-2019 0841  . [START ON 08/19/2019] cefTRIAXone (ROCEPHIN) 1 g in sodium chloride 0.9 % 100 mL IVPB  1 g Intravenous Q24H Selmer Dominion B, NP      . Chlorhexidine Gluconate Cloth 2 % PADS 6 each  6 each Topical Daily Agarwala, Ravi, MD      . octreotide (SANDOSTATIN) 500 mcg in sodium chloride 0.9 % 250 mL (2 mcg/mL) infusion  50 mcg/hr Intravenous Continuous Cardama, Amadeo Garnet, MD 25 mL/hr at 09-17-2019 0600 50 mcg/hr at 2019-09-17 0600  . sodium chloride flush (NS) 0.9 % injection 3 mL  3 mL Intravenous Once Cardama, Amadeo Garnet, MD        Allergies as of Sep 17, 2019  . (No Known Allergies)    No family history on file.  Social History   Socioeconomic History  . Marital status: Married    Spouse name: Not on file  . Number of children: Not on file  . Years of education: Not on file  . Highest  education level: Not on file  Occupational History  . Not on file  Tobacco Use  . Smoking status: Current Every Day Smoker  . Smokeless tobacco: Never Used  Substance and Sexual Activity  . Alcohol use: Yes  . Drug use: Not Currently  . Sexual activity: Not on file  Other Topics Concern  . Not on file  Social History Narrative  . Not on file   Social Determinants of Health   Financial Resource Strain:   . Difficulty of Paying Living Expenses:   Food Insecurity:   . Worried About Charity fundraiser in the Last Year:   . Arboriculturist in the Last Year:   Transportation Needs:   . Film/video editor (Medical):   Marland Kitchen Lack of Transportation (Non-Medical):   Physical Activity:   .  Days of Exercise per Week:   . Minutes of Exercise per Session:   Stress:   . Feeling of Stress :   Social Connections:   . Frequency of Communication with Friends and Family:   . Frequency of Social Gatherings with Friends and Family:   . Attends Religious Services:   . Active Member of Clubs or Organizations:   . Attends Archivist Meetings:   Marland Kitchen Marital Status:   Intimate Partner Violence:   . Fear of Current or Ex-Partner:   . Emotionally Abused:   Marland Kitchen Physically Abused:   . Sexually Abused:     Review of Systems: Unable to obtain due to altered mental status.  Physical Exam: Vital signs in last 24 hours: Temp:  [96.4 F (35.8 C)-98.5 F (36.9 C)] 98.2 F (36.8 C) (03/18 0747) Pulse Rate:  [66-87] 83 (03/18 0900) Resp:  [10-17] 12 (03/18 0900) BP: (68-105)/(35-90) 81/52 (03/18 0900) SpO2:  [96 %-100 %] 97 % (03/18 0900) Weight:  [52.2 kg] 52.2 kg (03/18 0036)   General:   Confused, somnolent Head:  Normocephalic and atraumatic. Eyes:  Scleral icterus.   Conjunctiva pink. Ears:  Normal auditory acuity. Nose:  No deformity, discharge,  or lesions. Mouth:  Left hematoma eyelid, and small abrasion on lower lip; No deformity or lesions.  Oropharynx pale and dry Neck:  Supple; no masses or thyromegaly. Lungs:  Clear throughout to auscultation.   No wheezes, crackles, or rhonchi. No acute distress. Heart:  Regular rate and rhythm; no murmurs, clicks, rubs,  or gallops. Abdomen:  Soft, mild distended, non-tender. No masses, hepatosplenomegaly or hernias noted. Normal bowel sounds, without guarding, and without rebound.     Msk:  Symmetrical without gross deformities. Normal posture. Pulses:  Normal pulses noted. Extremities:  Without clubbing or edema. Neurologic:  Confused Skin:  Intact without significant lesions or rashes. Psych:  Confused.   Lab Results: Recent Labs    September 15, 2019 0015  WBC 6.4  HGB 4.6*  HCT 13.7*  PLT 54*   BMET Recent Labs     15-Sep-2019 0015  NA 140  K 4.1  CL 113*  CO2 13*  GLUCOSE 114*  BUN 43*  CREATININE 2.26*  CALCIUM 8.4*   LFT Recent Labs    15-Sep-2019 0044  PROT 4.7*  ALBUMIN 2.4*  AST 88*  ALT 45*  ALKPHOS 84  BILITOT 4.7*  BILIDIR 2.1*  IBILI 2.6*   PT/INR Recent Labs    09-15-2019 0044 09/15/19 0944  LABPROT 28.5* 26.3*  INR 2.7* 2.4*    Studies/Results: CT ABDOMEN PELVIS WO CONTRAST  Result Date: Sep 15, 2019 CLINICAL DATA:  54 year old female with hypotensive  shock. EXAM: CT CHEST, ABDOMEN AND PELVIS WITHOUT CONTRAST TECHNIQUE: Multidetector CT imaging of the chest, abdomen and pelvis was performed following the standard protocol without IV contrast. COMPARISON:  CT abdomen pelvis dated 06/19/2017. FINDINGS: Evaluation of this exam is limited in the absence of intravenous contrast. CT CHEST FINDINGS Cardiovascular: There is no cardiomegaly or pericardial effusion. There is hypoattenuation of the cardiac blood pool suggestive of a degree of anemia. Clinical correlation is recommended. The thoracic aorta is unremarkable. Evaluation of the vasculature is limited in the absence of contrast. There is cephalization of the pulmonary vasculature, likely mild congestion. Mediastinum/Nodes: No definite hilar adenopathy. The esophagus is grossly unremarkable. No large mediastinal fluid collection. Small amount of fluid anterior to the aortic arch, likely related to pericardial recess. Evaluation is limited in the absence of contrast. Lungs/Pleura: Bilateral upper lobe clusters of ground-glass opacities most consistent with pneumonia. Clinical correlation recommended. There is no pleural effusion or pneumothorax. The central airways are patent. Musculoskeletal: Degenerative changes of the spine. No acute osseous pathology. CT ABDOMEN PELVIS FINDINGS No intra-abdominal free air. There is a small ascites, increased since the prior CT. Hepatobiliary: Cirrhosis. The gallbladder is distended. There are multiple  stones within the gallbladder. Evaluation of the gallbladder is limited on this CT. Ultrasound may provide better evaluation if there is clinical concern for acute cholecystitis. Pancreas: The pancreas is grossly unremarkable as visualized. Spleen: Splenomegaly measuring 18 cm in craniocaudal length (previously 13 mm). Adrenals/Urinary Tract: The adrenal glands are unremarkable. There is no hydronephrosis or nephrolithiasis on either side. The urinary bladder is grossly unremarkable. Stomach/Bowel: There is diffuse thickening of the small bowel and colon likely related to hepatic enteropathy and ascites. Clinical correlation is recommended to exclude enterocolitis. No bowel obstruction. The appendix is normal. Vascular/Lymphatic: The abdominal aorta and IVC are unremarkable. No portal venous gas. No adenopathy. Reproductive: The uterus is anteverted and grossly unremarkable. Bilateral tubal ligation clips. Other: Mild diffuse subcutaneous edema. Musculoskeletal: Degenerative changes of the SI joints. No acute osseous pathology. IMPRESSION: 1. Bilateral upper lobe clusters of ground-glass opacities most consistent with pneumonia. Clinical correlation is recommended. 2. Decompensated cirrhosis with progression of ascites and splenomegaly. 3. Distended gallbladder with multiple gallstones. Ultrasound may provide better evaluation if there is clinical concern for acute cholecystitis. 4. Diffuse thickening of the small bowel and colon likely related to hepatic enteropathy and ascites. Clinical correlation is recommended to exclude enterocolitis. No bowel obstruction. Normal appendix. 5. Findings of anemia. Electronically Signed   By: Elgie CollardArash  Radparvar M.D.   On: 08/16/2019 01:53   CT Head Wo Contrast  Result Date: 08/25/2019 CLINICAL DATA:  54 year old female with dizziness and head trauma. EXAM: CT HEAD WITHOUT CONTRAST TECHNIQUE: Contiguous axial images were obtained from the base of the skull through the vertex  without intravenous contrast. COMPARISON:  None. FINDINGS: Brain: The ventricles and sulci appropriate size for patient's age. The gray-white matter discrimination is preserved. There is no acute intracranial hemorrhage. No mass effect or midline shift no extra-axial fluid collection. Vascular: No hyperdense vessel or unexpected calcification. Skull: Normal. Negative for fracture or focal lesion. Sinuses/Orbits: The visualized paranasal sinuses and mastoid air cells are clear. There is a subcentimeter soft tissue nodule over the left eyelid. Clinical correlation is recommended. The globes and retro-orbital fat are preserved. Other: None IMPRESSION: 1. No acute intracranial pathology. 2. Subcentimeter soft tissue nodule over the left eyelid. Clinical correlation is recommended. Electronically Signed   By: Elgie CollardArash  Radparvar M.D.   On: 08/21/2019 01:39  CT CHEST WO CONTRAST  Result Date: 08/25/2019 CLINICAL DATA:  54 year old female with hypotensive shock. EXAM: CT CHEST, ABDOMEN AND PELVIS WITHOUT CONTRAST TECHNIQUE: Multidetector CT imaging of the chest, abdomen and pelvis was performed following the standard protocol without IV contrast. COMPARISON:  CT abdomen pelvis dated 06/19/2017. FINDINGS: Evaluation of this exam is limited in the absence of intravenous contrast. CT CHEST FINDINGS Cardiovascular: There is no cardiomegaly or pericardial effusion. There is hypoattenuation of the cardiac blood pool suggestive of a degree of anemia. Clinical correlation is recommended. The thoracic aorta is unremarkable. Evaluation of the vasculature is limited in the absence of contrast. There is cephalization of the pulmonary vasculature, likely mild congestion. Mediastinum/Nodes: No definite hilar adenopathy. The esophagus is grossly unremarkable. No large mediastinal fluid collection. Small amount of fluid anterior to the aortic arch, likely related to pericardial recess. Evaluation is limited in the absence of contrast.  Lungs/Pleura: Bilateral upper lobe clusters of ground-glass opacities most consistent with pneumonia. Clinical correlation recommended. There is no pleural effusion or pneumothorax. The central airways are patent. Musculoskeletal: Degenerative changes of the spine. No acute osseous pathology. CT ABDOMEN PELVIS FINDINGS No intra-abdominal free air. There is a small ascites, increased since the prior CT. Hepatobiliary: Cirrhosis. The gallbladder is distended. There are multiple stones within the gallbladder. Evaluation of the gallbladder is limited on this CT. Ultrasound may provide better evaluation if there is clinical concern for acute cholecystitis. Pancreas: The pancreas is grossly unremarkable as visualized. Spleen: Splenomegaly measuring 18 cm in craniocaudal length (previously 13 mm). Adrenals/Urinary Tract: The adrenal glands are unremarkable. There is no hydronephrosis or nephrolithiasis on either side. The urinary bladder is grossly unremarkable. Stomach/Bowel: There is diffuse thickening of the small bowel and colon likely related to hepatic enteropathy and ascites. Clinical correlation is recommended to exclude enterocolitis. No bowel obstruction. The appendix is normal. Vascular/Lymphatic: The abdominal aorta and IVC are unremarkable. No portal venous gas. No adenopathy. Reproductive: The uterus is anteverted and grossly unremarkable. Bilateral tubal ligation clips. Other: Mild diffuse subcutaneous edema. Musculoskeletal: Degenerative changes of the SI joints. No acute osseous pathology. IMPRESSION: 1. Bilateral upper lobe clusters of ground-glass opacities most consistent with pneumonia. Clinical correlation is recommended. 2. Decompensated cirrhosis with progression of ascites and splenomegaly. 3. Distended gallbladder with multiple gallstones. Ultrasound may provide better evaluation if there is clinical concern for acute cholecystitis. 4. Diffuse thickening of the small bowel and colon likely related  to hepatic enteropathy and ascites. Clinical correlation is recommended to exclude enterocolitis. No bowel obstruction. Normal appendix. 5. Findings of anemia. Electronically Signed   By: Elgie Collard M.D.   On: 08/25/2019 01:53   Impression:  1.  Alcoholic cirrhosis, decompensated (ascites, encephalopathy, coagulopathy). 2.  Anemia.  I can not attribute this principally to GI bleeding, given one isolated episode of hematemesis yesterday and none since and no blood in stool.  Suspect progressive bone marrow suppression from alcohol use. 3.  Alcohol abuse. 4.  Hematemesis x 1.  Not suggestive of variceal bleeding. 5.  Hepatic encephalopathy. 6.  Ascites. 7.  Acute renal failure.  HRS?  Plan:  1.  Patient is in no shape whatsoever for endoscopic evaluation today, and she does not appear to be actively bleeding. 2.  I would recommend medical therapy with octreotide, IV fluids, blood transfusion, PPI, antibiotics (ascites + GIB). 3.  Consider endoscopy tomorrow, pending her clinical course and pending her follow up platelet counts and INR. 4.  If renal function not improving, would get  nephrology consult. 5.  Lactulose for encephalopathy. 6.  Prognosis guarded.    LOS: 0 days   Emerson Schreifels M  2019-09-17, 10:23 AM  Cell [phone] If no answer or after 5 PM call [phone]
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
## 2019-08-18 NOTE — Progress Notes (Signed)
Received pt from ED. Pt a/ox4 with some mild confusion, pt easily reorients. Pts breath smells of etoh, pt denies nausea or abd pain. FFP, albumin, octreotide and NS infusing via peripheral iv sites. This rn intructed pt on safety precautions and use of call bell. This rn placed call bell within pt reach and floor mats placed.
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
## 2019-08-19 DIAGNOSIS — K922 Gastrointestinal hemorrhage, unspecified: Secondary | ICD-10-CM | POA: Diagnosis not present

## 2019-08-19 DIAGNOSIS — J690 Pneumonitis due to inhalation of food and vomit: Secondary | ICD-10-CM | POA: Diagnosis not present

## 2019-08-19 LAB — TYPE AND SCREEN
ABO/RH(D): A POS
Antibody Screen: NEGATIVE
Unit division: 0
Unit division: 0
Unit division: 0
Unit division: 0

## 2019-08-19 LAB — PREPARE FRESH FROZEN PLASMA
Unit division: 0
Unit division: 0

## 2019-08-19 LAB — BPAM FFP
Blood Product Expiration Date: 202103202359
Blood Product Expiration Date: 202103222359
Blood Product Expiration Date: 202103222359
ISSUE DATE / TIME: 202103180420
ISSUE DATE / TIME: 202103180447
ISSUE DATE / TIME: 202103181434
Unit Type and Rh: 6200
Unit Type and Rh: 6200
Unit Type and Rh: 6200

## 2019-08-19 LAB — BPAM RBC
Blood Product Expiration Date: 202104152359
Blood Product Expiration Date: 202104162359
Blood Product Expiration Date: 202104162359
Blood Product Expiration Date: 202104172359
ISSUE DATE / TIME: 202103180150
ISSUE DATE / TIME: 202103180317
ISSUE DATE / TIME: 202103181205
ISSUE DATE / TIME: 202103181258
Unit Type and Rh: 6200
Unit Type and Rh: 6200
Unit Type and Rh: 6200
Unit Type and Rh: 6200

## 2019-08-19 MED ORDER — WHITE PETROLATUM EX OINT
TOPICAL_OINTMENT | CUTANEOUS | Status: AC
  Administered 2019-08-19: 1
  Filled 2019-08-19: qty 28.35

## 2019-08-19 MED ORDER — SODIUM CHLORIDE 0.9 % IV SOLN
1000.0000 mL | INTRAVENOUS | Status: DC
  Administered 2019-08-19: 1000 mL via INTRAVENOUS

## 2019-08-19 MED ORDER — LACTULOSE 10 GM/15ML PO SOLN
20.0000 g | Freq: Three times a day (TID) | ORAL | Status: DC
  Administered 2019-08-19 – 2019-08-20 (×6): 20 g via ORAL
  Filled 2019-08-19 (×6): qty 30

## 2019-08-19 NOTE — Progress Notes (Signed)
Subjective: Remains somnolent. No further hematemesis. No melena or hematochezia.  Objective: Vital signs in last 24 hours: Temp:  [97.6 F (36.4 C)-98.7 F (37.1 C)] 98.4 F (36.9 C) (03/19 0700) Pulse Rate:  [79-89] 85 (03/19 1000) Resp:  [10-25] 16 (03/19 1000) BP: (86-113)/(45-78) 113/72 (03/19 1000) SpO2:  [92 %-100 %] 92 % (03/19 1000) Weight:  [60.6 kg] 60.6 kg (03/19 0500) Weight change: 8.436 kg Last BM Date: (PTA)  PE: GEN:  NAD, Somnolent, difficult to arouse HEENT: Jaundiced  Lab Results: CBC    Component Value Date/Time   WBC 4.7 08/17/2019 0944   RBC 1.79 (L) 08/02/2019 0944   HGB 9.1 (L) 08/23/2019 2015   HCT 26.1 (L) 08/03/2019 2015   PLT 40 (L) 08/15/2019 0944   MCV 98.9 08/11/2019 0944   MCH 34.1 (H) 08/22/2019 0944   MCHC 34.5 08/06/2019 0944   RDW 21.5 (H) 08/09/2019 0944   CMP     Component Value Date/Time   NA 142 08/07/2019 0944   K 4.0 08/21/2019 0944   CL 117 (H) 08/17/2019 0944   CO2 16 (L) 08/08/2019 0944   GLUCOSE 111 (H) 08/04/2019 0944   BUN 38 (H) 08/30/2019 0944   CREATININE 1.93 (H) 08/23/2019 0944   CALCIUM 7.5 (L) 08/09/2019 0944   PROT 4.7 (L) 08/31/2019 0944   ALBUMIN 2.6 (L) 08/25/2019 0944   AST 71 (H) 08/07/2019 0944   ALT 41 08/09/2019 0944   ALKPHOS 70 08/02/2019 0944   BILITOT 4.5 (H) 08/07/2019 0944   GFRNONAA 29 (L) 08/04/2019 0944   GFRAA 34 (L) 08/08/2019 0944   Assessment:  1.  Alcoholic cirrhosis, decompensated (ascites, encephalopathy, coagulopathy). 2.  Anemia.  I can not attribute this principally to GI bleeding, given one isolated episode of hematemesis yesterday and none since and no blood in stool.  Suspect progressive bone marrow suppression from alcohol use, and likely portal hypertensive gastropathy component as well. 3.  Alcohol abuse. 4.  Hematemesis x 1.  Not suggestive of variceal bleeding. 5.  Hepatic encephalopathy. 6.  Ascites. 7.  Acute renal failure.  HRS?  Improving.  Plan:  1.   Supportive care:  PPI, octreotide, volume repletion, serial cbcs. 2.  Primary team to increase lactulose dosing. 3.  [facility] will follow.  Endoscopy at some point pre-discharge, once and if her mental status and overall clinical status improves.  There is no evidence of overt rampant bleeding. 4.  Case discussed with PCCM team.   Freddy Jaksch 08/19/2019, 11:13 AM   Cell [phone] If no answer or after 5 PM call [phone]
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
## 2019-08-20 ENCOUNTER — Inpatient Hospital Stay (HOSPITAL_COMMUNITY): Payer: Self-pay

## 2019-08-20 DIAGNOSIS — K7460 Unspecified cirrhosis of liver: Secondary | ICD-10-CM | POA: Diagnosis not present

## 2019-08-20 DIAGNOSIS — K922 Gastrointestinal hemorrhage, unspecified: Secondary | ICD-10-CM | POA: Diagnosis not present

## 2019-08-20 DIAGNOSIS — K7290 Hepatic failure, unspecified without coma: Secondary | ICD-10-CM

## 2019-08-20 LAB — COMPREHENSIVE METABOLIC PANEL
ALT: 55 U/L — ABNORMAL HIGH (ref 0–44)
AST: 108 U/L — ABNORMAL HIGH (ref 15–41)
Albumin: 2.8 g/dL — ABNORMAL LOW (ref 3.5–5.0)
Alkaline Phosphatase: 66 U/L (ref 38–126)
Anion gap: 9 (ref 5–15)
BUN: 18 mg/dL (ref 6–20)
CO2: 16 mmol/L — ABNORMAL LOW (ref 22–32)
Calcium: 7.9 mg/dL — ABNORMAL LOW (ref 8.9–10.3)
Chloride: 118 mmol/L — ABNORMAL HIGH (ref 98–111)
Creatinine, Ser: 0.99 mg/dL (ref 0.44–1.00)
GFR calc Af Amer: >60 mL/min (ref >60)
GFR calc non Af Amer: >60 mL/min (ref >60)
Glucose, Bld: 131 mg/dL — ABNORMAL HIGH (ref 70–99)
Potassium: 3.6 mmol/L (ref 3.5–5.1)
Sodium: 143 mmol/L (ref 135–145)
Total Bilirubin: 9.9 mg/dL — ABNORMAL HIGH (ref 0.3–1.2)
Total Protein: 5.3 g/dL — ABNORMAL LOW (ref 6.5–8.1)

## 2019-08-20 LAB — CBC
HCT: 26.8 % — ABNORMAL LOW (ref 36.0–46.0)
Hemoglobin: 9.5 g/dL — ABNORMAL LOW (ref 12.0–15.0)
MCH: 33.2 pg (ref 26.0–34.0)
MCHC: 35.4 g/dL (ref 30.0–36.0)
MCV: 93.7 fL (ref 80.0–100.0)
Platelets: 49 10*3/uL — ABNORMAL LOW (ref 150–400)
RBC: 2.86 MIL/uL — ABNORMAL LOW (ref 3.87–5.11)
RDW: 20.4 % — ABNORMAL HIGH (ref 11.5–15.5)
WBC: 9 10*3/uL (ref 4.0–10.5)
nRBC: 0 % (ref 0.0–0.2)

## 2019-08-20 LAB — GLUCOSE, CAPILLARY
Glucose-Capillary: 122 mg/dL — ABNORMAL HIGH (ref 70–99)
Glucose-Capillary: 146 mg/dL — ABNORMAL HIGH (ref 70–99)

## 2019-08-20 LAB — PROTIME-INR
INR: 2.8 — ABNORMAL HIGH (ref 0.8–1.2)
Prothrombin Time: 29.2 seconds — ABNORMAL HIGH (ref 11.4–15.2)

## 2019-08-20 LAB — MAGNESIUM
Magnesium: 1.8 mg/dL (ref 1.7–2.4)
Magnesium: 1.8 mg/dL (ref 1.7–2.4)

## 2019-08-20 LAB — PHOSPHORUS
Phosphorus: 2.2 mg/dL — ABNORMAL LOW (ref 2.5–4.6)
Phosphorus: 2.3 mg/dL — ABNORMAL LOW (ref 2.5–4.6)

## 2019-08-20 MED ORDER — ORAL CARE MOUTH RINSE
15.0000 mL | Freq: Two times a day (BID) | OROMUCOSAL | Status: DC
  Administered 2019-08-20 – 2019-08-21 (×2): 15 mL via OROMUCOSAL

## 2019-08-20 MED ORDER — THIAMINE HCL 100 MG PO TABS: 100.0000 mg | ORAL_TABLET | Freq: Every day | ORAL | Status: DC

## 2019-08-20 MED ORDER — ADULT MULTIVITAMIN W/MINERALS CH: 1.0000 | ORAL_TABLET | Freq: Every day | ORAL | Status: DC

## 2019-08-20 MED ORDER — LORAZEPAM 2 MG/ML IJ SOLN
1.0000 mg | INTRAMUSCULAR | Status: DC | PRN
  Filled 2019-08-20: qty 1

## 2019-08-20 MED ORDER — ADULT MULTIVITAMIN W/MINERALS CH
1.0000 | ORAL_TABLET | Freq: Every day | ORAL | Status: DC
  Administered 2019-08-20 – 2019-08-23 (×3): 1 via ORAL
  Filled 2019-08-20 (×4): qty 1

## 2019-08-20 MED ORDER — THIAMINE HCL 100 MG/ML IJ SOLN
100.0000 mg | Freq: Every day | INTRAMUSCULAR | Status: DC
  Administered 2019-08-21 – 2019-08-22 (×2): 100 mg via INTRAVENOUS
  Filled 2019-08-20 (×3): qty 2

## 2019-08-20 MED ORDER — SERTRALINE HCL 50 MG PO TABS
100.0000 mg | ORAL_TABLET | Freq: Every day | ORAL | Status: DC
  Administered 2019-08-20 – 2019-08-24 (×4): 100 mg
  Filled 2019-08-20 (×4): qty 2

## 2019-08-20 MED ORDER — FOLIC ACID 1 MG PO TABS
1.0000 mg | ORAL_TABLET | Freq: Every day | ORAL | Status: DC
  Administered 2019-08-20 – 2019-08-23 (×3): 1 mg via ORAL
  Filled 2019-08-20 (×4): qty 1

## 2019-08-20 MED ORDER — THIAMINE HCL 100 MG PO TABS
100.0000 mg | ORAL_TABLET | Freq: Every day | ORAL | Status: DC
  Administered 2019-08-20 – 2019-08-23 (×2): 100 mg via ORAL
  Filled 2019-08-20 (×2): qty 1

## 2019-08-20 MED ORDER — LORAZEPAM 2 MG/ML IJ SOLN
1.0000 mg | INTRAMUSCULAR | Status: DC | PRN
  Administered 2019-08-20: 1 mg via INTRAVENOUS
  Administered 2019-08-21: 2 mg via INTRAVENOUS
  Filled 2019-08-20: qty 1

## 2019-08-20 MED ORDER — FOLIC ACID 1 MG PO TABS: 1.0000 mg | ORAL_TABLET | Freq: Every day | ORAL | Status: DC

## 2019-08-20 MED ORDER — LORAZEPAM 1 MG PO TABS: 1.0000 mg | ORAL_TABLET | ORAL | Status: DC | PRN

## 2019-08-20 MED ORDER — PRO-STAT SUGAR FREE PO LIQD
30.0000 mL | Freq: Every day | ORAL | Status: DC
  Administered 2019-08-20 – 2019-08-23 (×3): 30 mL
  Filled 2019-08-20 (×3): qty 30

## 2019-08-20 MED ORDER — CHLORHEXIDINE GLUCONATE 0.12 % MT SOLN
15.0000 mL | Freq: Two times a day (BID) | OROMUCOSAL | Status: DC
  Administered 2019-08-20 – 2019-08-21 (×3): 15 mL via OROMUCOSAL
  Filled 2019-08-20 (×3): qty 15

## 2019-08-20 MED ORDER — VITAL 1.5 CAL PO LIQD
1000.0000 mL | ORAL | Status: DC
  Administered 2019-08-20 – 2019-08-22 (×3): 1000 mL
  Filled 2019-08-20 (×5): qty 1000
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
## 2019-08-20 NOTE — Progress Notes (Signed)
Reported off to [place], [employer] fundraiser.  Pt night pretty uneventful, however pt with guarding and avoidance of using right arm, she does move the extremity however she moans with movement. Cap refills brisk, extremity warm and dry, rad pulse palpable. Dayshift RN notified to follow-up during rounding.

## 2019-08-20 NOTE — Progress Notes (Signed)
Subjective: Somnolent. No reported bleeding.  Objective: Vital signs in last 24 hours: Temp:  [97 F (36.1 C)-98.6 F (37 C)] 97 F (36.1 C) (03/20 1114) Pulse Rate:  [85-97] 89 (03/20 1300) Resp:  [14-27] 15 (03/20 1300) BP: (96-123)/(61-97) 112/72 (03/20 1300) SpO2:  [88 %-98 %] 94 % (03/20 1300) Weight:  [61.3 kg] 61.3 kg (03/20 0420) Weight change: 0.7 kg Last BM Date: 08/19/19  PE: GEN:  Somnolent, can't arouse to speech ABD:  Soft, non-tender HEENT:  NGT in place  Lab Results: CBC    Component Value Date/Time   WBC 9.0 08/20/2019 0926   RBC 2.86 (L) 08/20/2019 0926   HGB 9.5 (L) 08/20/2019 0926   HCT 26.8 (L) 08/20/2019 0926   PLT 49 (L) 08/20/2019 0926   MCV 93.7 08/20/2019 0926   MCH 33.2 08/20/2019 0926   MCHC 35.4 08/20/2019 0926   RDW 20.4 (H) 08/20/2019 0926   CMP     Component Value Date/Time   NA 143 08/20/2019 0926   K 3.6 08/20/2019 0926   CL 118 (H) 08/20/2019 0926   CO2 16 (L) 08/20/2019 0926   GLUCOSE 131 (H) 08/20/2019 0926   BUN 18 08/20/2019 0926   CREATININE 0.99 08/20/2019 0926   CALCIUM 7.9 (L) 08/20/2019 0926   PROT 5.3 (L) 08/20/2019 0926   ALBUMIN 2.8 (L) 08/20/2019 0926   AST 108 (H) 08/20/2019 0926   ALT 55 (H) 08/20/2019 0926   ALKPHOS 66 08/20/2019 0926   BILITOT 9.9 (H) 08/20/2019 0926   GFRNONAA >60 08/20/2019 0926   GFRAA >60 08/20/2019 0926   Assessment:  1. Alcoholic cirrhosis, decompensated (ascites, encephalopathy, coagulopathy). 2. Anemia. I can not attribute this principally to GI bleeding, given one isolated episode of hematemesis yesterday and none since and no blood in stool. Suspect progressive bone marrow suppression from alcohol use, and likely portal hypertensive gastropathy component as well. 3. Alcohol abuse. 4. Hematemesis x 1. Not suggestive of variceal bleeding. 5. Hepatic encephalopathy. 6. Ascites. 7. Acute renal failure. HRS?  Improving.  Plan:  1.  Supportive care. 2.  No  endoscopy unless patient wakes up and/or has rampant destabilizing hemorrhage. 3.  [facility] will revisit Monday 08/22/19.   Victoria Peck 08/20/2019, 2:53 PM   Cell [phone] If no answer or after 5 PM call [phone]

## 2019-08-20 NOTE — Progress Notes (Signed)
PCCM Interval Note  Updated father and mother at bedside regarding patient's overall medical course including her decompensated alcoholic cirrhosis, potential progression and management. Family aware that her active alcohol use would preclude her from transplant evaluation at this time.   Offered palliative however family declined.  Patient is clinically stable enough to transfer to floor with no further evidence of bleed. TRH paged.  Mechele Collin, M.D. [facility] Pulmonary/Critical Care Medicine 08/20/2019 12:53 PM

## 2019-08-21 ENCOUNTER — Inpatient Hospital Stay (HOSPITAL_COMMUNITY): Payer: Self-pay

## 2019-08-21 DIAGNOSIS — D500 Iron deficiency anemia secondary to blood loss (chronic): Secondary | ICD-10-CM | POA: Diagnosis not present

## 2019-08-21 DIAGNOSIS — K920 Hematemesis: Secondary | ICD-10-CM | POA: Diagnosis not present

## 2019-08-21 DIAGNOSIS — J690 Pneumonitis due to inhalation of food and vomit: Secondary | ICD-10-CM | POA: Diagnosis not present

## 2019-08-21 DIAGNOSIS — K7290 Hepatic failure, unspecified without coma: Secondary | ICD-10-CM | POA: Diagnosis not present

## 2019-08-21 DIAGNOSIS — Z7189 Other specified counseling: Secondary | ICD-10-CM | POA: Diagnosis not present

## 2019-08-21 DIAGNOSIS — E7220 Disorder of urea cycle metabolism, unspecified: Secondary | ICD-10-CM | POA: Diagnosis not present

## 2019-08-21 DIAGNOSIS — N179 Acute kidney failure, unspecified: Secondary | ICD-10-CM | POA: Diagnosis not present

## 2019-08-21 DIAGNOSIS — K7460 Unspecified cirrhosis of liver: Secondary | ICD-10-CM | POA: Diagnosis not present

## 2019-08-21 DIAGNOSIS — K922 Gastrointestinal hemorrhage, unspecified: Secondary | ICD-10-CM | POA: Diagnosis not present

## 2019-08-21 LAB — CBC
HCT: 26.2 % — ABNORMAL LOW (ref 36.0–46.0)
HCT: 23.5 % — ABNORMAL LOW (ref 36.0–46.0)
Hemoglobin: 9.1 g/dL — ABNORMAL LOW (ref 12.0–15.0)
Hemoglobin: 8.3 g/dL — ABNORMAL LOW (ref 12.0–15.0)
MCH: 32.2 pg (ref 26.0–34.0)
MCH: 32.8 pg (ref 26.0–34.0)
MCHC: 34.7 g/dL (ref 30.0–36.0)
MCHC: 35.3 g/dL (ref 30.0–36.0)
MCV: 92.6 fL (ref 80.0–100.0)
MCV: 92.9 fL (ref 80.0–100.0)
Platelets: 50 10*3/uL — ABNORMAL LOW (ref 150–400)
Platelets: 85 10*3/uL — ABNORMAL LOW (ref 150–400)
RBC: 2.83 MIL/uL — ABNORMAL LOW (ref 3.87–5.11)
RBC: 2.53 MIL/uL — ABNORMAL LOW (ref 3.87–5.11)
RDW: 20.3 % — ABNORMAL HIGH (ref 11.5–15.5)
RDW: 20.6 % — ABNORMAL HIGH (ref 11.5–15.5)
WBC: 9.9 10*3/uL (ref 4.0–10.5)
WBC: 12.7 10*3/uL — ABNORMAL HIGH (ref 4.0–10.5)
nRBC: 0 % (ref 0.0–0.2)
nRBC: 0 % (ref 0.0–0.2)

## 2019-08-21 LAB — POCT I-STAT 7, (LYTES, BLD GAS, ICA,H+H)
Acid-base deficit: 8 mmol/L — ABNORMAL HIGH (ref 0.0–2.0)
Acid-base deficit: 9 mmol/L — ABNORMAL HIGH (ref 0.0–2.0)
Bicarbonate: 15.9 mmol/L — ABNORMAL LOW (ref 20.0–28.0)
Bicarbonate: 18.4 mmol/L — ABNORMAL LOW (ref 20.0–28.0)
Calcium, Ion: 1.21 mmol/L (ref 1.15–1.40)
Calcium, Ion: 1.24 mmol/L (ref 1.15–1.40)
HCT: 22 % — ABNORMAL LOW (ref 36.0–46.0)
HCT: 25 % — ABNORMAL LOW (ref 36.0–46.0)
Hemoglobin: 7.5 g/dL — ABNORMAL LOW (ref 12.0–15.0)
Hemoglobin: 8.5 g/dL — ABNORMAL LOW (ref 12.0–15.0)
O2 Saturation: 97 %
O2 Saturation: 99 %
Patient temperature: 99
Potassium: 4 mmol/L (ref 3.5–5.1)
Potassium: 4 mmol/L (ref 3.5–5.1)
Sodium: 148 mmol/L — ABNORMAL HIGH (ref 135–145)
Sodium: 151 mmol/L — ABNORMAL HIGH (ref 135–145)
TCO2: 17 mmol/L — ABNORMAL LOW (ref 22–32)
TCO2: 20 mmol/L — ABNORMAL LOW (ref 22–32)
pCO2 arterial: 27.3 mmHg — ABNORMAL LOW (ref 32.0–48.0)
pCO2 arterial: 45.1 mmHg (ref 32.0–48.0)
pH, Arterial: 7.219 — ABNORMAL LOW (ref 7.350–7.450)
pH, Arterial: 7.374 (ref 7.350–7.450)
pO2, Arterial: 155 mmHg — ABNORMAL HIGH (ref 83.0–108.0)
pO2, Arterial: 90 mmHg (ref 83.0–108.0)

## 2019-08-21 LAB — COMPREHENSIVE METABOLIC PANEL
ALT: 54 U/L — ABNORMAL HIGH (ref 0–44)
AST: 83 U/L — ABNORMAL HIGH (ref 15–41)
Albumin: 2.7 g/dL — ABNORMAL LOW (ref 3.5–5.0)
Alkaline Phosphatase: 65 U/L (ref 38–126)
Anion gap: 9 (ref 5–15)
BUN: 21 mg/dL — ABNORMAL HIGH (ref 6–20)
CO2: 16 mmol/L — ABNORMAL LOW (ref 22–32)
Calcium: 8.4 mg/dL — ABNORMAL LOW (ref 8.9–10.3)
Chloride: 120 mmol/L — ABNORMAL HIGH (ref 98–111)
Creatinine, Ser: 0.95 mg/dL (ref 0.44–1.00)
GFR calc Af Amer: >60 mL/min (ref >60)
GFR calc non Af Amer: >60 mL/min (ref >60)
Glucose, Bld: 178 mg/dL — ABNORMAL HIGH (ref 70–99)
Potassium: 3.6 mmol/L (ref 3.5–5.1)
Sodium: 145 mmol/L (ref 135–145)
Total Bilirubin: 11.3 mg/dL — ABNORMAL HIGH (ref 0.3–1.2)
Total Protein: 5.3 g/dL — ABNORMAL LOW (ref 6.5–8.1)

## 2019-08-21 LAB — GLUCOSE, CAPILLARY
Glucose-Capillary: 120 mg/dL — ABNORMAL HIGH (ref 70–99)
Glucose-Capillary: 122 mg/dL — ABNORMAL HIGH (ref 70–99)
Glucose-Capillary: 127 mg/dL — ABNORMAL HIGH (ref 70–99)
Glucose-Capillary: 145 mg/dL — ABNORMAL HIGH (ref 70–99)
Glucose-Capillary: 150 mg/dL — ABNORMAL HIGH (ref 70–99)
Glucose-Capillary: 154 mg/dL — ABNORMAL HIGH (ref 70–99)

## 2019-08-21 LAB — MAGNESIUM
Magnesium: 1.9 mg/dL (ref 1.7–2.4)
Magnesium: 1.9 mg/dL (ref 1.7–2.4)

## 2019-08-21 LAB — PHOSPHORUS
Phosphorus: 1.6 mg/dL — ABNORMAL LOW (ref 2.5–4.6)
Phosphorus: 4.2 mg/dL (ref 2.5–4.6)

## 2019-08-21 LAB — PROTIME-INR
INR: 2.9 — ABNORMAL HIGH (ref 0.8–1.2)
INR: 2.2 — ABNORMAL HIGH (ref 0.8–1.2)
Prothrombin Time: 30.5 seconds — ABNORMAL HIGH (ref 11.4–15.2)
Prothrombin Time: 24.2 seconds — ABNORMAL HIGH (ref 11.4–15.2)

## 2019-08-21 LAB — AMMONIA: Ammonia: 188 umol/L — ABNORMAL HIGH (ref 9–35)

## 2019-08-21 MED ORDER — ROCURONIUM BROMIDE 50 MG/5ML IV SOLN
70.0000 mg | Freq: Once | INTRAVENOUS | Status: AC
  Administered 2019-08-21: 70 mg via INTRAVENOUS
  Filled 2019-08-21: qty 7

## 2019-08-21 MED ORDER — FENTANYL CITRATE (PF) 100 MCG/2ML IJ SOLN: 50.0000 ug | INTRAMUSCULAR | Status: DC | PRN

## 2019-08-21 MED ORDER — OCTREOTIDE LOAD VIA INFUSION
50.0000 ug | Freq: Once | INTRAVENOUS | Status: AC
  Administered 2019-08-21: 50 ug via INTRAVENOUS
  Filled 2019-08-21: qty 25

## 2019-08-21 MED ORDER — LACTULOSE ENEMA
300.0000 mL | Freq: Three times a day (TID) | ORAL | Status: DC
  Administered 2019-08-21: 300 mL via RECTAL
  Filled 2019-08-21 (×12): qty 300

## 2019-08-21 MED ORDER — FENTANYL CITRATE (PF) 100 MCG/2ML IJ SOLN
50.0000 ug | INTRAMUSCULAR | Status: DC | PRN
  Administered 2019-08-21 – 2019-08-22 (×5): 100 ug via INTRAVENOUS
  Filled 2019-08-21 (×5): qty 2

## 2019-08-21 MED ORDER — SODIUM CHLORIDE 0.9 % IV SOLN
2.0000 g | Freq: Three times a day (TID) | INTRAVENOUS | Status: DC
  Administered 2019-08-21 – 2019-08-23 (×6): 2 g via INTRAVENOUS
  Filled 2019-08-21 (×6): qty 2

## 2019-08-21 MED ORDER — FENTANYL CITRATE (PF) 100 MCG/2ML IJ SOLN
INTRAMUSCULAR | Status: AC
  Filled 2019-08-21: qty 2

## 2019-08-21 MED ORDER — SODIUM CHLORIDE 0.9% IV SOLUTION: Freq: Once | INTRAVENOUS | Status: AC

## 2019-08-21 MED ORDER — SODIUM CHLORIDE 0.9 % IV SOLN
50.0000 ug/h | INTRAVENOUS | Status: DC
  Administered 2019-08-21 – 2019-08-22 (×2): 50 ug/h via INTRAVENOUS
  Filled 2019-08-21 (×4): qty 1

## 2019-08-21 MED ORDER — PROPOFOL 1000 MG/100ML IV EMUL
0.0000 ug/kg/min | INTRAVENOUS | Status: DC
  Administered 2019-08-21: 5 ug/kg/min via INTRAVENOUS
  Administered 2019-08-22: 25 ug/kg/min via INTRAVENOUS
  Administered 2019-08-22: 30 ug/kg/min via INTRAVENOUS
  Administered 2019-08-22: 25 ug/kg/min via INTRAVENOUS
  Filled 2019-08-21 (×4): qty 100

## 2019-08-21 MED ORDER — POTASSIUM PHOSPHATES 15 MMOLE/5ML IV SOLN
30.0000 mmol | Freq: Once | INTRAVENOUS | Status: AC
  Administered 2019-08-21: 30 mmol via INTRAVENOUS
  Filled 2019-08-21 (×2): qty 10

## 2019-08-21 MED ORDER — VITAMIN K1 10 MG/ML IJ SOLN
10.0000 mg | Freq: Once | INTRAVENOUS | Status: AC
  Administered 2019-08-21: 10 mg via INTRAVENOUS
  Filled 2019-08-21: qty 1

## 2019-08-21 MED ORDER — MIDAZOLAM HCL 2 MG/2ML IJ SOLN
1.0000 mg | Freq: Once | INTRAMUSCULAR | Status: AC
  Administered 2019-08-21: 1 mg via INTRAVENOUS

## 2019-08-21 MED ORDER — LACTULOSE 10 GM/15ML PO SOLN
20.0000 g | Freq: Three times a day (TID) | ORAL | Status: DC
  Administered 2019-08-22 – 2019-08-24 (×8): 20 g via ORAL
  Filled 2019-08-21 (×9): qty 30

## 2019-08-21 MED ORDER — FENTANYL CITRATE (PF) 100 MCG/2ML IJ SOLN
50.0000 ug | Freq: Once | INTRAMUSCULAR | Status: AC
  Administered 2019-08-21: 50 ug via INTRAVENOUS

## 2019-08-21 MED ORDER — ETOMIDATE 2 MG/ML IV SOLN
20.0000 mg | Freq: Once | INTRAVENOUS | Status: AC
  Administered 2019-08-21: 20 mg via INTRAVENOUS

## 2019-08-21 MED ORDER — MIDAZOLAM HCL 2 MG/2ML IJ SOLN
INTRAMUSCULAR | Status: AC
  Filled 2019-08-21: qty 2

## 2019-08-21 NOTE — Progress Notes (Signed)
   08/21/19 0925  Vitals  BP 120/77  MAP (mmHg) 89  BP Location Right Arm  BP Method Automatic  Patient Position (if appropriate) Lying  Pulse Rate (!) 102  Pulse Rate Source Monitor  ECG Heart Rate (!) 102  Cardiac Rhythm ST  Resp (!) 25  Level of Consciousness  Level of Consciousness Unresponsive  Oxygen Therapy  SpO2 97 %  O2 Device Nasal Cannula  O2 Flow Rate (L/min) 4 L/min  Patient Activity (if Appropriate) In bed   Spoke with patient's spouse, who will be in to visit shortly.  MD notified.  Status unchanged from previous notation.

## 2019-08-21 NOTE — Progress Notes (Signed)
   08/21/19 0755  Vitals  BP 130/65  MAP (mmHg) 85  BP Location Right Arm  BP Method Automatic  Patient Position (if appropriate) Lying  Pulse Rate (!) 110  Pulse Rate Source Monitor  ECG Heart Rate (!) 110  Cardiac Rhythm ST  Resp 18  Level of Consciousness  Level of Consciousness Unresponsive  Oxygen Therapy  SpO2 95 %  O2 Device Nasal Cannula  O2 Flow Rate (L/min) 4 L/min  Patient Activity (if Appropriate) In bed  Pulse Oximetry Type Continuous  Pre-WUA / WUA Start  [place] Agitation Sedation Scale (RASS) -4  Pain Assessment  Pain Scale PAINAD  Pain Score 1  PAINAD (Pain Assessment in Advanced Dementia)  Breathing 1  Negative Vocalization 0  Facial Expression 0  Body Language 0  Consolability 0  PAINAD Score 1  Complaints & Interventions  Neuro symptoms relieved by Anti-anxiety medication;Other (Comment) (Ativan)  PCA/Epidural/Spinal Assessment  Respiratory Pattern Regular;Labored  Glasgow Coma Scale  Eye Opening 1  Best Verbal Response (NON-intubated) 1  Modified Verbal Response (INTUBATED) 1  Best Motor Response 1  Glasgow Coma Scale Score 4  MEWS Score  MEWS Temp 0  MEWS Systolic 0  MEWS Pulse 1  MEWS RR 0  MEWS LOC 3  MEWS Score 4  MEWS Score Color Red  MEWS Assessment  Is this an acute change? No   Patient medicated for increased CIWA score of 12 by previous shift.  Dr. Sharl Ma aware.  NG out.  MD to write NO.

## 2019-08-21 NOTE — Significant Event (Signed)
PCCM Interval Note  Patient continued to have desaturations on NRB. Decision was made to intubate. Husband and mother at bedside.  On intubation, blood secretions noted in ETT as well as OGT. Bronchoscopy demonstrated ETT was right main-stemed. ETT was withdrawn to above carina. Left side of lung was aspirated blood which was suctioned out. No evidence of active bleed in lungs.   OGT with 100cc frank blood on immediate suction. GI team made aware. Recommended supportive management and reversal of INR and platelet transfusion for goal INR <2 and Plt >75 respectively. Blood products have been ordered.
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
## 2019-08-21 NOTE — Procedures (Signed)
Bedside Bronchoscopy Procedure Note Victoria Peck [id number] April 19, 1966  Procedure: Bronchoscopy Indications: Diagnostic evaluation of the airways  Procedure Details: ET Tube Size: ET Tube secured at lip (cm): Bite block in place: No In preparation for procedure, Patient hyper-oxygenated with 100 % FiO2 Airway entered and the following bronchi were examined: RUL, RML, RLL, LUL, LLL and Bronchi.   Bronchoscope removed.  , Patient placed back on 100% FiO2 at conclusion of procedure.    Evaluation BP 130/87   Pulse (!) 110   Temp 98.9 F (37.2 C) (Axillary)   Resp 16   Ht 5\' 4"  (1.626 m)   Wt 61.1 kg   SpO2 (!) 81%   BMI 23.12 kg/m  Breath Sounds:Rhonch O2 sats: stable throughout Patient's Current Condition: stable Specimens:  None Complications: No apparent complications Patient did tolerate procedure well.   08/21/2019, 5:07 PM

## 2019-08-21 NOTE — Progress Notes (Signed)
eLink Physician-Brief Progress Note Patient Name: Victoria Peck DOB: [birth year] MRN: [id number]   Date of Service  08/21/2019  HPI/Events of Note  Alcoholic cirrhosis with UGIB. Now intubated with blood from OG tube. Dyssynchronous with ventilator.  First post-intubation ABG which was on RR 16.  ABG    Component Value Date/Time   PHART 7.219 (L) 08/21/2019 1756   PCO2ART 45.1 08/21/2019 1756   PO2ART 155.0 (H) 08/21/2019 1756   HCO3 18.4 (L) 08/21/2019 1756   TCO2 20 (L) 08/21/2019 1756   ACIDBASEDEF 9.0 (H) 08/21/2019 1756   O2SAT 99.0 08/21/2019 1756   She is now on a RR of 20, but dyssynchronous.    eICU Interventions  Start propofol drip and fentanyl pushes for sedation to achieve better ventilator synchrony.   Keep current RR of 20.  Recheck ABG at midnight after better sedated. May still need further increases in RR.  Also, will start octreotide drip given c/f variceal hemorrhage. Already on PPI IV and ABX.     Intervention Category Intermediate Interventions: Bleeding - evaluation and treatment with blood products Minor Interventions: Agitation / anxiety - evaluation and management  Janae Bridgeman 08/21/2019, 9:55 PM

## 2019-08-21 NOTE — Progress Notes (Signed)
Patient's mother and husband in to visit.  Patient remains unresponsive to vocal / painful stimuli.  Family are indicating to me that they want her to be comfortable.  They also asked if I felt there would be a possibility that she would ever be able to come home and live an independent life again.  Referred to Dr. Sharl Ma.

## 2019-08-21 NOTE — Procedures (Signed)
Bronchoscopy Procedure Note Victoria Peck [id number] 06/07/1965  Procedure: Bronchoscopy Indications: Diagnostic evaluation of the airways  Procedure Details Consent: Risks of procedure as well as the alternatives and risks of each were explained to the (patient/caregiver).  Consent for procedure obtained. Time Out: Verified patient identification, verified procedure, site/side was marked, verified correct patient position, special equipment/implants available, medications/allergies/relevent history reviewed, required imaging and test results available.  Performed  In preparation for procedure, patient was given 100% FiO2 and bronchoscope lubricated. Sedation: Etomidate  Airway entered and the following bronchi were examined: RUL, RML, RLL, LUL, LLL and Bronchi.  Left upper and lower lobes with bloody secretions easily suctioned. Subsegmental airways visualized. No evidence of active bleeding. Right airway examined. No secretions or evidence of active bleeding.  Evaluation Hemodynamic Status: BP stable throughout; O2 sats: stable throughout Patient's Current Condition: stable Specimens:  None Complications: No apparent complications Patient did tolerate procedure well.   Victoria Peck 08/21/2019

## 2019-08-21 NOTE — Procedures (Signed)
Intubation Procedure Note Victoria Peck [id number] [birth year]  Procedure: Intubation Indications: Respiratory insufficiency  Procedure Details Consent: Risks of procedure as well as the alternatives and risks of each were explained to the (patient/caregiver).  Consent for procedure obtained. Time Out: Verified patient identification, verified procedure, site/side was marked, verified correct patient position, special equipment/implants available, medications/allergies/relevent history reviewed, required imaging and test results available.  Performed  Maximum sterile technique was used including gloves, hand hygiene and mask.  3  Bloody secretions in airway. Visualized vocal cords with anterior but grade I airway. ETT placed between cords.   Evaluation Hemodynamic Status: BP stable throughout; O2 sats: stable throughout Patient's Current Condition: stable Complications: No apparent complications Patient did tolerate procedure well. Chest X-ray ordered to verify placement.  CXR: pending.   Victoria Peck Victoria Peck 08/21/2019

## 2019-08-21 NOTE — Progress Notes (Signed)
Pharmacy Antibiotic Note  Victoria Peck is a 54 y.o. female admitted on 08/19/2019 with aspiration pneumonia.  Pharmacy has been consulted for cefepime dosing.  Patient with active GIB from cirrhosis and INR 2.9 now s/p bronchoscopy with bloody aspiration in airways. No active bleeding in airways. WBC 4.7> 9.9. Afebrile but now tachycardic. Patient was on ceftriaxone now broadened to cefepime. AKI on admission resolved.   Plan: Start cefepime 2g Q8hr  Monitor cultures, clinical status, renal fx Narrow abx as able and f/u duration   Height: 5\' 4"  (162.6 cm) Weight: 134 lb 11.2 oz (61.1 kg) IBW/kg (Calculated) : 54.7  Temp (24hrs), Avg:98.7 F (37.1 C), Min:98.2 F (36.8 C), Max:99.1 F (37.3 C)  Recent Labs  Lab Aug 19, 2019 0015 August 19, 2019 0944 08/20/19 0926 08/21/19 0338  WBC 6.4 4.7 9.0 9.9  CREATININE 2.26* 1.93* 0.99 0.95    Estimated Creatinine Clearance: 59.1 mL/min (by C-G formula based on SCr of 0.95 mg/dL).    No Known Allergies  Antimicrobials this admission: CTX 3/18 >> 3/21 Cefepime 3/21 >>   Microbiology results: 3/18 Covid/flu: neg  3/18 MRSA PCR: neg  4/18, PharmD, BCPS, BCCP Clinical Pharmacist  Please check AMION for all [facility] Pharmacy phone numbers After 10:00 PM, call Main Pharmacy [phone]

## 2019-08-21 NOTE — Plan of Care (Signed)
Discussed with critical care team about patient having blood per orogastric tube.  Patient has platelets 50k and INR 2.9.  Advised FFP/platelets for goal INR < 2 and Plts > 75k.  If bleeding persists once/if INR and platelet goals have been met, would consider endoscopy.

## 2019-08-21 NOTE — Progress Notes (Addendum)
NG tube inserted into right nares by [place], RN, of RR for medication administration and enteral feeding.  Portable dg abdomen ordered.  Patient more arousible, but remains unable to follow commands and is obtunded.  Extremity movements non-purposeful.  Requires use of NRB mask to maintain sats > 93%.  Patient having epistaxis and is bleeding orally.  Family aware of patient's status and have been in touch with physicians re: plan of care.
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
## 2019-08-21 NOTE — Significant Event (Addendum)
Rapid Response Event Note  Overview: Time Called: 0850 Event Type: MEWS Called for red MEWS related to tachycardia and level of consciousness. Per RN, pt was very restless this morning around 0600 and received IV Ativan. Following ativan administration pt became hard to arouse, VSS despite tachycardia. RN did not need assistance of rapid response RN at time of initial call. RN to call rapid response for further needs.  Rounded on pt at 11:50am. Dr. Sharl Ma at bedside discussing goals of care with pt's family. Family wanted to keep the pt a full code and continue current interventions at this time. RR RN called away to another emergency.  Initial Focused Assessment: Pt lying in bed, minimal response to pain. Pt intermittently turns her head and attempts to open her eyes to sounds in the room. Skin is warm, dry to touch. Lung sounds are rhonchus, CXR completed this morning. SpO2 is 93% on 4LNC. Heart sounds are masked by lung sounds. Pt is jaundice, dried blood noted in pt's mouth and lips. UOP approximately 150cc.   Interventions: No reversal of Ativan at this time. Monitor pt for improvement in level of consciousness as Ativan effects wear off. Lactulose route to be changed from oral to rectal if pt unable to take POs.   Throughout the day pt has not shown improvement in mentation or alertness. Concern for airway compromise in the setting of bleeding oral cavity, poor gag reflex, and decreased LOC. Dr. Sharl Ma shared similar concern and consult was placed to PCCM.   NGT replaced per Dr. Everardo All, KUB ordered. Pt nose bleeding following NGT placement. Pt supplemental oxygen increased, Dr. Everardo All at bedside. Pt placed on 15L NRB to maintain oxygen saturation.   Plan of Care (if not transferred): Transfer to ICU  Event Summary: Outcome: Transferred (Comment)(Transferred to ICU) Event End Time: 1505  Jennye Moccasin

## 2019-08-21 NOTE — Progress Notes (Signed)
ETT pulled back to 23 at lip per MD during bronchoscopy

## 2019-08-21 NOTE — Progress Notes (Signed)
Pt has had several episodes of turning herself on her stomach and slouching down toward foot of the bed.  Pt has pulled out nasogastric tube.  Heart rate has increased to 110s up to 140s nonsustained, RR 20s with increased oxygen requirement at 5L to maintain sats above 90%.  Critical Care RN notified with new orders.  Will continue to monitor patient closely.

## 2019-08-21 NOTE — Progress Notes (Signed)
ABG done,. Results reported to CCM MD. RR increased to 20 per MD order.

## 2019-08-22 ENCOUNTER — Inpatient Hospital Stay (HOSPITAL_COMMUNITY): Payer: Self-pay

## 2019-08-22 DIAGNOSIS — K922 Gastrointestinal hemorrhage, unspecified: Secondary | ICD-10-CM | POA: Diagnosis not present

## 2019-08-22 DIAGNOSIS — J9601 Acute respiratory failure with hypoxia: Secondary | ICD-10-CM

## 2019-08-22 DIAGNOSIS — K7290 Hepatic failure, unspecified without coma: Secondary | ICD-10-CM | POA: Diagnosis not present

## 2019-08-22 DIAGNOSIS — T17908A Unspecified foreign body in respiratory tract, part unspecified causing other injury, initial encounter: Secondary | ICD-10-CM

## 2019-08-22 LAB — POCT I-STAT 7, (LYTES, BLD GAS, ICA,H+H)
Acid-base deficit: 9 mmol/L — ABNORMAL HIGH (ref 0.0–2.0)
Acid-base deficit: 8 mmol/L — ABNORMAL HIGH (ref 0.0–2.0)
Bicarbonate: 15.7 mmol/L — ABNORMAL LOW (ref 20.0–28.0)
Bicarbonate: 16.6 mmol/L — ABNORMAL LOW (ref 20.0–28.0)
Calcium, Ion: 1.23 mmol/L (ref 1.15–1.40)
Calcium, Ion: 1.22 mmol/L (ref 1.15–1.40)
HCT: 21 % — ABNORMAL LOW (ref 36.0–46.0)
HCT: 19 % — ABNORMAL LOW (ref 36.0–46.0)
Hemoglobin: 7.1 g/dL — ABNORMAL LOW (ref 12.0–15.0)
Hemoglobin: 6.5 g/dL — CL (ref 12.0–15.0)
O2 Saturation: 99 %
O2 Saturation: 100 %
Patient temperature: 98.9
Patient temperature: 98.7
Potassium: 4 mmol/L (ref 3.5–5.1)
Potassium: 3.8 mmol/L (ref 3.5–5.1)
Sodium: 150 mmol/L — ABNORMAL HIGH (ref 135–145)
Sodium: 150 mmol/L — ABNORMAL HIGH (ref 135–145)
TCO2: 17 mmol/L — ABNORMAL LOW (ref 22–32)
TCO2: 17 mmol/L — ABNORMAL LOW (ref 22–32)
pCO2 arterial: 29.6 mmHg — ABNORMAL LOW (ref 32.0–48.0)
pCO2 arterial: 27.8 mmHg — ABNORMAL LOW (ref 32.0–48.0)
pH, Arterial: 7.334 — ABNORMAL LOW (ref 7.350–7.450)
pH, Arterial: 7.386 (ref 7.350–7.450)
pO2, Arterial: 129 mmHg — ABNORMAL HIGH (ref 83.0–108.0)
pO2, Arterial: 352 mmHg — ABNORMAL HIGH (ref 83.0–108.0)

## 2019-08-22 LAB — BPAM PLATELET PHERESIS
Blood Product Expiration Date: 202103222359
Blood Product Expiration Date: 202103232359
ISSUE DATE / TIME: 202103211808
ISSUE DATE / TIME: 202103211808
Unit Type and Rh: 6200
Unit Type and Rh: 7300

## 2019-08-22 LAB — PREPARE FRESH FROZEN PLASMA

## 2019-08-22 LAB — PREPARE PLATELET PHERESIS
Unit division: 0
Unit division: 0

## 2019-08-22 LAB — CBC
HCT: 24.2 % — ABNORMAL LOW (ref 36.0–46.0)
Hemoglobin: 8.6 g/dL — ABNORMAL LOW (ref 12.0–15.0)
MCH: 33 pg (ref 26.0–34.0)
MCHC: 35.5 g/dL (ref 30.0–36.0)
MCV: 92.7 fL (ref 80.0–100.0)
Platelets: 80 10*3/uL — ABNORMAL LOW (ref 150–400)
RBC: 2.61 MIL/uL — ABNORMAL LOW (ref 3.87–5.11)
RDW: 20.4 % — ABNORMAL HIGH (ref 11.5–15.5)
WBC: 12.7 10*3/uL — ABNORMAL HIGH (ref 4.0–10.5)
nRBC: 0 % (ref 0.0–0.2)

## 2019-08-22 LAB — COMPREHENSIVE METABOLIC PANEL
ALT: 47 U/L — ABNORMAL HIGH (ref 0–44)
AST: 59 U/L — ABNORMAL HIGH (ref 15–41)
Albumin: 2.6 g/dL — ABNORMAL LOW (ref 3.5–5.0)
Alkaline Phosphatase: 61 U/L (ref 38–126)
Anion gap: 9 (ref 5–15)
BUN: 27 mg/dL — ABNORMAL HIGH (ref 6–20)
CO2: 16 mmol/L — ABNORMAL LOW (ref 22–32)
Calcium: 8.2 mg/dL — ABNORMAL LOW (ref 8.9–10.3)
Chloride: 122 mmol/L — ABNORMAL HIGH (ref 98–111)
Creatinine, Ser: 0.97 mg/dL (ref 0.44–1.00)
GFR calc Af Amer: >60 mL/min (ref >60)
GFR calc non Af Amer: >60 mL/min (ref >60)
Glucose, Bld: 124 mg/dL — ABNORMAL HIGH (ref 70–99)
Potassium: 3.8 mmol/L (ref 3.5–5.1)
Sodium: 147 mmol/L — ABNORMAL HIGH (ref 135–145)
Total Bilirubin: 11 mg/dL — ABNORMAL HIGH (ref 0.3–1.2)
Total Protein: 5 g/dL — ABNORMAL LOW (ref 6.5–8.1)

## 2019-08-22 LAB — BPAM FFP
Blood Product Expiration Date: 202103242359
Blood Product Expiration Date: 202103242359
ISSUE DATE / TIME: 202103211719
ISSUE DATE / TIME: 202103211719
Unit Type and Rh: 600
Unit Type and Rh: 6200

## 2019-08-22 LAB — GLUCOSE, CAPILLARY
Glucose-Capillary: 108 mg/dL — ABNORMAL HIGH (ref 70–99)
Glucose-Capillary: 109 mg/dL — ABNORMAL HIGH (ref 70–99)
Glucose-Capillary: 116 mg/dL — ABNORMAL HIGH (ref 70–99)
Glucose-Capillary: 123 mg/dL — ABNORMAL HIGH (ref 70–99)
Glucose-Capillary: 135 mg/dL — ABNORMAL HIGH (ref 70–99)
Glucose-Capillary: 144 mg/dL — ABNORMAL HIGH (ref 70–99)
Glucose-Capillary: 148 mg/dL — ABNORMAL HIGH (ref 70–99)

## 2019-08-22 LAB — PHOSPHORUS: Phosphorus: 3.3 mg/dL (ref 2.5–4.6)

## 2019-08-22 LAB — PROTIME-INR
INR: 2.4 — ABNORMAL HIGH (ref 0.8–1.2)
Prothrombin Time: 25.7 seconds — ABNORMAL HIGH (ref 11.4–15.2)

## 2019-08-22 LAB — TRIGLYCERIDES: Triglycerides: 143 mg/dL (ref <150)

## 2019-08-22 MED ORDER — ORAL CARE MOUTH RINSE
15.0000 mL | OROMUCOSAL | Status: DC
  Administered 2019-08-22 – 2019-08-24 (×27): 15 mL via OROMUCOSAL

## 2019-08-22 MED ORDER — VITAMIN K1 10 MG/ML IJ SOLN
10.0000 mg | Freq: Once | INTRAVENOUS | Status: AC
  Administered 2019-08-22: 10 mg via INTRAVENOUS
  Filled 2019-08-22: qty 1

## 2019-08-22 MED ORDER — CHLORHEXIDINE GLUCONATE 0.12% ORAL RINSE (MEDLINE KIT)
15.0000 mL | Freq: Two times a day (BID) | OROMUCOSAL | Status: DC
  Administered 2019-08-21 – 2019-08-24 (×6): 15 mL via OROMUCOSAL

## 2019-08-22 NOTE — Progress Notes (Signed)
Paged GI doctor and received call back. GI is okay with restarting tube feeds due to no more observed bleeding at this time. Will follow existing tube feed order, per MD. Will start tube feeds at 20 ml/hr and slowly increase to goal rate.   GI will continue to follow patient and see her again tomorrow.   Darrold Span, RN
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
## 2019-08-22 NOTE — Progress Notes (Signed)
Intubated yesterday.  Scant amount blood in orogastric tube.  No reports of melena.  No hematemesis during current hospitalization.  Suggest ongoing conservative management.  No plans for endoscopy unless patient has overt destabilizing bleeding and INR <2 and Platelets >70k.  Hgb stable.  [facility] will follow along.

## 2019-08-22 NOTE — Progress Notes (Signed)
Called to assess patient for desaturation.  Patient currently on 90%, +8.  Vent desynchrony despite increased on medication.  Changed to PC. ABG  Reported to Elink. Vent changes made per ABG results.

## 2019-08-22 NOTE — Progress Notes (Signed)
Patient O2 sats were 85% upon entering room. Patients breathing was much more labored. 100 mcg Fentanyl given. Pt remains on propofol. Called respiratory while suctioning patient. Respiratory agreed that her breathing is more labored. ABG obtained. Called CCM [phone] number to notify them of situation and results. Continue to monitor. No further orders received at this time.  Darrold Span, RN

## 2019-08-23 DIAGNOSIS — N179 Acute kidney failure, unspecified: Secondary | ICD-10-CM | POA: Diagnosis not present

## 2019-08-23 DIAGNOSIS — Z9289 Personal history of other medical treatment: Secondary | ICD-10-CM

## 2019-08-23 DIAGNOSIS — J690 Pneumonitis due to inhalation of food and vomit: Secondary | ICD-10-CM | POA: Diagnosis not present

## 2019-08-23 DIAGNOSIS — K7200 Acute and subacute hepatic failure without coma: Secondary | ICD-10-CM

## 2019-08-23 DIAGNOSIS — J9601 Acute respiratory failure with hypoxia: Secondary | ICD-10-CM | POA: Diagnosis not present

## 2019-08-23 DIAGNOSIS — T17908A Unspecified foreign body in respiratory tract, part unspecified causing other injury, initial encounter: Secondary | ICD-10-CM | POA: Diagnosis not present

## 2019-08-23 DIAGNOSIS — R0902 Hypoxemia: Secondary | ICD-10-CM | POA: Diagnosis present

## 2019-08-23 LAB — CBC
HCT: 25.3 % — ABNORMAL LOW (ref 36.0–46.0)
HCT: 24.6 % — ABNORMAL LOW (ref 36.0–46.0)
Hemoglobin: 8.6 g/dL — ABNORMAL LOW (ref 12.0–15.0)
Hemoglobin: 8.2 g/dL — ABNORMAL LOW (ref 12.0–15.0)
MCH: 32.5 pg (ref 26.0–34.0)
MCH: 32.5 pg (ref 26.0–34.0)
MCHC: 34 g/dL (ref 30.0–36.0)
MCHC: 33.3 g/dL (ref 30.0–36.0)
MCV: 95.5 fL (ref 80.0–100.0)
MCV: 97.6 fL (ref 80.0–100.0)
Platelets: 56 10*3/uL — ABNORMAL LOW (ref 150–400)
Platelets: 52 10*3/uL — ABNORMAL LOW (ref 150–400)
RBC: 2.65 MIL/uL — ABNORMAL LOW (ref 3.87–5.11)
RBC: 2.52 MIL/uL — ABNORMAL LOW (ref 3.87–5.11)
RDW: 21.1 % — ABNORMAL HIGH (ref 11.5–15.5)
RDW: 21.1 % — ABNORMAL HIGH (ref 11.5–15.5)
WBC: 13.3 10*3/uL — ABNORMAL HIGH (ref 4.0–10.5)
WBC: 14.5 10*3/uL — ABNORMAL HIGH (ref 4.0–10.5)
nRBC: 0 % (ref 0.0–0.2)
nRBC: 0 % (ref 0.0–0.2)

## 2019-08-23 LAB — POCT I-STAT 7, (LYTES, BLD GAS, ICA,H+H)
Acid-base deficit: 10 mmol/L — ABNORMAL HIGH (ref 0.0–2.0)
Bicarbonate: 14.7 mmol/L — ABNORMAL LOW (ref 20.0–28.0)
Calcium, Ion: 1.24 mmol/L (ref 1.15–1.40)
HCT: 20 % — ABNORMAL LOW (ref 36.0–46.0)
Hemoglobin: 6.8 g/dL — CL (ref 12.0–15.0)
O2 Saturation: 100 %
Patient temperature: 99.1
Potassium: 3.7 mmol/L (ref 3.5–5.1)
Sodium: 151 mmol/L — ABNORMAL HIGH (ref 135–145)
TCO2: 15 mmol/L — ABNORMAL LOW (ref 22–32)
pCO2 arterial: 25.4 mmHg — ABNORMAL LOW (ref 32.0–48.0)
pH, Arterial: 7.372 (ref 7.350–7.450)
pO2, Arterial: 174 mmHg — ABNORMAL HIGH (ref 83.0–108.0)

## 2019-08-23 LAB — COMPREHENSIVE METABOLIC PANEL
ALT: 45 U/L — ABNORMAL HIGH (ref 0–44)
AST: 70 U/L — ABNORMAL HIGH (ref 15–41)
Albumin: 2.4 g/dL — ABNORMAL LOW (ref 3.5–5.0)
Alkaline Phosphatase: 68 U/L (ref 38–126)
Anion gap: 10 (ref 5–15)
BUN: 43 mg/dL — ABNORMAL HIGH (ref 6–20)
CO2: 14 mmol/L — ABNORMAL LOW (ref 22–32)
Calcium: 8.1 mg/dL — ABNORMAL LOW (ref 8.9–10.3)
Chloride: 124 mmol/L — ABNORMAL HIGH (ref 98–111)
Creatinine, Ser: 1.46 mg/dL — ABNORMAL HIGH (ref 0.44–1.00)
GFR calc Af Amer: 47 mL/min — ABNORMAL LOW (ref >60)
GFR calc non Af Amer: 41 mL/min — ABNORMAL LOW (ref >60)
Glucose, Bld: 186 mg/dL — ABNORMAL HIGH (ref 70–99)
Potassium: 3.8 mmol/L (ref 3.5–5.1)
Sodium: 148 mmol/L — ABNORMAL HIGH (ref 135–145)
Total Bilirubin: 8.8 mg/dL — ABNORMAL HIGH (ref 0.3–1.2)
Total Protein: 4.9 g/dL — ABNORMAL LOW (ref 6.5–8.1)

## 2019-08-23 LAB — PROTIME-INR
INR: 2.7 — ABNORMAL HIGH (ref 0.8–1.2)
Prothrombin Time: 28.7 seconds — ABNORMAL HIGH (ref 11.4–15.2)

## 2019-08-23 LAB — AMMONIA: Ammonia: 134 umol/L — ABNORMAL HIGH (ref 9–35)

## 2019-08-23 LAB — TRIGLYCERIDES: Triglycerides: 171 mg/dL — ABNORMAL HIGH (ref <150)

## 2019-08-23 LAB — GLUCOSE, CAPILLARY
Glucose-Capillary: 176 mg/dL — ABNORMAL HIGH (ref 70–99)
Glucose-Capillary: 168 mg/dL — ABNORMAL HIGH (ref 70–99)
Glucose-Capillary: 159 mg/dL — ABNORMAL HIGH (ref 70–99)
Glucose-Capillary: 175 mg/dL — ABNORMAL HIGH (ref 70–99)

## 2019-08-23 MED ORDER — SODIUM CHLORIDE 0.9 % IV SOLN: 250.0000 mL | INTRAVENOUS | Status: DC

## 2019-08-23 MED ORDER — PRO-STAT SUGAR FREE PO LIQD
30.0000 mL | Freq: Two times a day (BID) | ORAL | Status: DC
  Administered 2019-08-23 – 2019-08-24 (×2): 30 mL
  Filled 2019-08-23 (×2): qty 30

## 2019-08-23 MED ORDER — FREE WATER
200.0000 mL | Freq: Four times a day (QID) | Status: DC
  Administered 2019-08-23 – 2019-08-24 (×4): 200 mL

## 2019-08-23 MED ORDER — FENTANYL CITRATE (PF) 100 MCG/2ML IJ SOLN
25.0000 ug | INTRAMUSCULAR | Status: DC | PRN
  Administered 2019-08-24 (×2): 100 ug via INTRAVENOUS
  Filled 2019-08-23 (×2): qty 2

## 2019-08-23 MED ORDER — VITAL 1.5 CAL PO LIQD
1000.0000 mL | ORAL | Status: DC
  Administered 2019-08-23 – 2019-08-24 (×2): 1000 mL
  Filled 2019-08-23 (×2): qty 1000

## 2019-08-23 MED ORDER — VITAMIN K1 10 MG/ML IJ SOLN
10.0000 mg | Freq: Once | INTRAVENOUS | Status: AC
  Administered 2019-08-23: 10 mg via INTRAVENOUS
  Filled 2019-08-23: qty 1

## 2019-08-23 MED ORDER — DEXMEDETOMIDINE HCL IN NACL 400 MCG/100ML IV SOLN
0.4000 ug/kg/h | INTRAVENOUS | Status: DC
  Administered 2019-08-23: 0.6 ug/kg/h via INTRAVENOUS
  Administered 2019-08-23: 1 ug/kg/h via INTRAVENOUS
  Administered 2019-08-24: 1.2 ug/kg/h via INTRAVENOUS
  Administered 2019-08-24: 1 ug/kg/h via INTRAVENOUS
  Filled 2019-08-23 (×4): qty 100

## 2019-08-23 MED ORDER — INSULIN ASPART 100 UNIT/ML ~~LOC~~ SOLN
0.0000 [IU] | SUBCUTANEOUS | Status: DC
  Administered 2019-08-23 (×4): 2 [IU] via SUBCUTANEOUS
  Administered 2019-08-24 (×3): 1 [IU] via SUBCUTANEOUS
  Administered 2019-08-24: 2 [IU] via SUBCUTANEOUS

## 2019-08-23 MED ORDER — VITAL 1.5 CAL PO LIQD: 1000.0000 mL | ORAL | Status: DC

## 2019-08-23 MED ORDER — NOREPINEPHRINE 4 MG/250ML-% IV SOLN
0.0000 ug/min | INTRAVENOUS | Status: DC
  Administered 2019-08-23: 4 ug/min via INTRAVENOUS
  Administered 2019-08-24: 5 ug/min via INTRAVENOUS
  Administered 2019-08-24: 35 ug/min via INTRAVENOUS
  Filled 2019-08-23 (×3): qty 250

## 2019-08-23 MED ORDER — SODIUM CHLORIDE 0.9 % IV SOLN
2.0000 g | Freq: Two times a day (BID) | INTRAVENOUS | Status: DC
  Administered 2019-08-23: 2 g via INTRAVENOUS
  Filled 2019-08-23: qty 2

## 2019-08-23 NOTE — Progress Notes (Signed)
eLink Physician-Brief Progress Note Patient Name: Victoria Peck DOB: 03-Dec-1965 MRN: [id number]   Date of Service  08/23/2019  HPI/Events of Note  Pt with ventilator dyssynchrony and desaturation on Precedex.  eICU Interventions  PRN Fentanyl added for better sedation.        Thomasene Lot Breon Diss 08/23/2019, 11:58 PM

## 2019-08-23 NOTE — Progress Notes (Signed)
Pharmacy Antibiotic Note  Victoria Peck is a 54 y.o. female admitted on 01-Sep-2019 with aspiration pneumonia.  Pharmacy has been consulted for cefepime dosing.  Patient with active GIB from cirrhosis and INR 2.9 now s/p bronchoscopy with bloody aspiration in airways. No active bleeding in airways.  Patient was on ceftriaxone now broadened to cefepime.  Renal function is worsening again, afebrile, WBC up to 13.3.  Plan: Change cefepime to 2gm IV Q12H Monitor renal fxn, clinical progress  Height: 5\' 4"  (162.6 cm) Weight: 150 lb 9.2 oz (68.3 kg) IBW/kg (Calculated) : 54.7  Temp (24hrs), Avg:98.7 F (37.1 C), Min:98.1 F (36.7 C), Max:99.1 F (37.3 C)  Recent Labs  Lab 09/01/19 0944 09-01-2019 0944 08/20/19 0926 08/21/19 0338 08/21/19 2052 08/22/19 0338 08/23/19 0226  WBC 4.7   < > 9.0 9.9 12.7* 12.7* 13.3*  CREATININE 1.93*  --  0.99 0.95  --  0.97 1.46*   < > = values in this interval not displayed.    Estimated Creatinine Clearance: 42.3 mL/min (A) (by C-G formula based on SCr of 1.46 mg/dL (H)).    No Known Allergies  CTX 3/18 >> 3/21 Cefepime 3/21 >>  3/18 MRSA PCR / covid / flu - negative  Lenor Provencher D. 4/18, PharmD, BCPS, BCCCP 08/23/2019, 9:17 AM

## 2019-08-23 NOTE — Progress Notes (Signed)
eLink Physician-Brief Progress Note Patient Name: Victoria Peck DOB: 12/18/1965 MRN: [id number]   Date of Service  08/23/2019  HPI/Events of Note  ABG result noted.  eICU Interventions  PC dropped to 16, FIO2 dropped to 50 %.        [name] 08/23/2019, 5:08 AM

## 2019-08-23 NOTE — Progress Notes (Signed)
Nutrition Follow-up  DOCUMENTATION CODES:   Not applicable  INTERVENTION:   Change Vital 1.5 goal rate to 40 ml/h. Increase Pro-stat to 30 ml BID.  Provides 1640 kcal, 95 gm protein, 733 ml free water daily.  NUTRITION DIAGNOSIS:   Inadequate oral intake related to inability to eat as evidenced by NPO status.  Ongoing   GOAL:   Patient will meet greater than or equal to 90% of their needs  Progressing   MONITOR:   TF tolerance, Weight trends, Labs   ASSESSMENT:   Pt with a PMH significant for alcoholic cirrhosis, HTN, ascites of liver, and GERD was admitted for decompensated alcoholic cirrhosis with hepatic encephalopathy and probable GI bleed.  Discussed patient in ICU rounds and with RN today. Patient required intubation on 3/21 due to respiratory failure from aspirated blood.    ? Hepatorenal syndrome. Receiving Vital 1.5 at 40 ml/h via OG tube. RN increasing to goal rate of 50 ml/h today. Free water 200 ml every 6 hours. Patient is tolerating TF well.   Patient is currently intubated on ventilator support MV: 14.5 L/min Temp (24hrs), Avg:98.7 F (37.1 C), Min:98.1 F (36.7 C), Max:99.1 F (37.3 C)  Propofol: off  Labs reviewed. Na 151 (H), free water flushes added today.  CBG's: [phone]  Medications reviewed and include folic acid, novolog, lactulose, MVI, thiamine, precedex, levophed.  Weight 68.3 kg today, 60.6 kg 3/19. I/O +8 L  Diet Order:   Diet Order            Diet NPO time specified  Diet effective now              EDUCATION NEEDS:   Not appropriate for education at this time  Skin:  Skin Assessment: Reviewed RN Assessment  Last BM:  3/23 type 7  Height:   Ht Readings from Last 1 Encounters:  08/16/2019 5\' 4"  (1.626 m)    Weight:   Wt Readings from Last 1 Encounters:  08/23/19 68.3 kg    BMI:  Body mass index is 25.85 kg/m.  Estimated Nutritional Needs:   Kcal:  1620  Protein:  90-105 grams  Fluid:  1.6-1.8  L    08/25/19, RD, LDN, CNSC Please refer to Amion for contact information.
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
## 2019-08-23 NOTE — Progress Notes (Signed)
Patients diamond ring placed in specimen cup, patient label placed on specimen cup and taken down to Security by Vidal Schwalbe, secretary, to be placed in secure safe as requested by patients husband.

## 2019-08-23 NOTE — Progress Notes (Signed)
Patient with no further blood in orogastric tube.  No blood in stool.  Hgb drop to 6.8.  INR 2.7.  Platelets 56.  We have no plan to do endoscopy unless overt destabilizing bleeding.  We will follow along.

## 2019-08-23 NOTE — Progress Notes (Signed)
PCCM progress note  Patiens spouse and sister updated at bedside, all questions answer.   Delfin Gant, NP-C [facility] Contact / Pager information can be found on Amion  08/23/2019, 12:18 PM

## 2019-08-23 NOTE — Progress Notes (Signed)
Pt.'s diamond ring on the ring finger taken off d/t increasing edema in BUE. Ring placed in a patient-labeled specimen cup and placed at bedside. Husband notified via telephone and states he will pick up tomorrow. Night shift notified.

## 2019-08-24 ENCOUNTER — Inpatient Hospital Stay (HOSPITAL_COMMUNITY): Payer: Self-pay

## 2019-08-24 DIAGNOSIS — N179 Acute kidney failure, unspecified: Secondary | ICD-10-CM | POA: Diagnosis not present

## 2019-08-24 DIAGNOSIS — J9601 Acute respiratory failure with hypoxia: Secondary | ICD-10-CM | POA: Diagnosis not present

## 2019-08-24 DIAGNOSIS — J9600 Acute respiratory failure, unspecified whether with hypoxia or hypercapnia: Secondary | ICD-10-CM | POA: Diagnosis present

## 2019-08-24 DIAGNOSIS — R579 Shock, unspecified: Secondary | ICD-10-CM | POA: Diagnosis not present

## 2019-08-24 LAB — POCT I-STAT 7, (LYTES, BLD GAS, ICA,H+H)
Acid-base deficit: 13 mmol/L — ABNORMAL HIGH (ref 0.0–2.0)
Acid-base deficit: 12 mmol/L — ABNORMAL HIGH (ref 0.0–2.0)
Acid-base deficit: 16 mmol/L — ABNORMAL HIGH (ref 0.0–2.0)
Acid-base deficit: 15 mmol/L — ABNORMAL HIGH (ref 0.0–2.0)
Bicarbonate: 11.5 mmol/L — ABNORMAL LOW (ref 20.0–28.0)
Bicarbonate: 12.2 mmol/L — ABNORMAL LOW (ref 20.0–28.0)
Bicarbonate: 13.7 mmol/L — ABNORMAL LOW (ref 20.0–28.0)
Bicarbonate: 14.1 mmol/L — ABNORMAL LOW (ref 20.0–28.0)
Calcium, Ion: 1.27 mmol/L (ref 1.15–1.40)
Calcium, Ion: 1.25 mmol/L (ref 1.15–1.40)
Calcium, Ion: 1.24 mmol/L (ref 1.15–1.40)
Calcium, Ion: 1.21 mmol/L (ref 1.15–1.40)
HCT: 21 % — ABNORMAL LOW (ref 36.0–46.0)
HCT: 21 % — ABNORMAL LOW (ref 36.0–46.0)
HCT: 23 % — ABNORMAL LOW (ref 36.0–46.0)
HCT: 22 % — ABNORMAL LOW (ref 36.0–46.0)
Hemoglobin: 7.1 g/dL — ABNORMAL LOW (ref 12.0–15.0)
Hemoglobin: 7.1 g/dL — ABNORMAL LOW (ref 12.0–15.0)
Hemoglobin: 7.8 g/dL — ABNORMAL LOW (ref 12.0–15.0)
Hemoglobin: 7.5 g/dL — ABNORMAL LOW (ref 12.0–15.0)
O2 Saturation: 88 %
O2 Saturation: 94 %
O2 Saturation: 95 %
O2 Saturation: 84 %
Patient temperature: 101.4
Patient temperature: 100.2
Patient temperature: 101.2
Patient temperature: 101.2
Potassium: 3.7 mmol/L (ref 3.5–5.1)
Potassium: 3.9 mmol/L (ref 3.5–5.1)
Potassium: 4 mmol/L (ref 3.5–5.1)
Potassium: 4.5 mmol/L (ref 3.5–5.1)
Sodium: 151 mmol/L — ABNORMAL HIGH (ref 135–145)
Sodium: 150 mmol/L — ABNORMAL HIGH (ref 135–145)
Sodium: 149 mmol/L — ABNORMAL HIGH (ref 135–145)
Sodium: 148 mmol/L — ABNORMAL HIGH (ref 135–145)
TCO2: 12 mmol/L — ABNORMAL LOW (ref 22–32)
TCO2: 13 mmol/L — ABNORMAL LOW (ref 22–32)
TCO2: 15 mmol/L — ABNORMAL LOW (ref 22–32)
TCO2: 16 mmol/L — ABNORMAL LOW (ref 22–32)
pCO2 arterial: 21.8 mmHg — ABNORMAL LOW (ref 32.0–48.0)
pCO2 arterial: 22 mmHg — ABNORMAL LOW (ref 32.0–48.0)
pCO2 arterial: 52.1 mmHg — ABNORMAL HIGH (ref 32.0–48.0)
pCO2 arterial: 52 mmHg — ABNORMAL HIGH (ref 32.0–48.0)
pH, Arterial: 7.336 — ABNORMAL LOW (ref 7.350–7.450)
pH, Arterial: 7.355 (ref 7.350–7.450)
pH, Arterial: 7.038 — CL (ref 7.350–7.450)
pH, Arterial: 7.052 — CL (ref 7.350–7.450)
pO2, Arterial: 61 mmHg — ABNORMAL LOW (ref 83.0–108.0)
pO2, Arterial: 75 mmHg — ABNORMAL LOW (ref 83.0–108.0)
pO2, Arterial: 115 mmHg — ABNORMAL HIGH (ref 83.0–108.0)
pO2, Arterial: 76 mmHg — ABNORMAL LOW (ref 83.0–108.0)

## 2019-08-24 LAB — COMPREHENSIVE METABOLIC PANEL
ALT: 44 U/L (ref 0–44)
AST: 69 U/L — ABNORMAL HIGH (ref 15–41)
Albumin: 2.1 g/dL — ABNORMAL LOW (ref 3.5–5.0)
Alkaline Phosphatase: 101 U/L (ref 38–126)
Anion gap: 10 (ref 5–15)
BUN: 65 mg/dL — ABNORMAL HIGH (ref 6–20)
CO2: 12 mmol/L — ABNORMAL LOW (ref 22–32)
Calcium: 8.1 mg/dL — ABNORMAL LOW (ref 8.9–10.3)
Chloride: 125 mmol/L — ABNORMAL HIGH (ref 98–111)
Creatinine, Ser: 2.14 mg/dL — ABNORMAL HIGH (ref 0.44–1.00)
GFR calc Af Amer: 30 mL/min — ABNORMAL LOW (ref >60)
GFR calc non Af Amer: 26 mL/min — ABNORMAL LOW (ref >60)
Glucose, Bld: 138 mg/dL — ABNORMAL HIGH (ref 70–99)
Potassium: 4 mmol/L (ref 3.5–5.1)
Sodium: 147 mmol/L — ABNORMAL HIGH (ref 135–145)
Total Bilirubin: 6.9 mg/dL — ABNORMAL HIGH (ref 0.3–1.2)
Total Protein: 4.6 g/dL — ABNORMAL LOW (ref 6.5–8.1)

## 2019-08-24 LAB — URINALYSIS, ROUTINE W REFLEX MICROSCOPIC
Bacteria, UA: NONE SEEN
Glucose, UA: NEGATIVE mg/dL
Hgb urine dipstick: NEGATIVE
Ketones, ur: 5 mg/dL — AB
Leukocytes,Ua: NEGATIVE
Nitrite: NEGATIVE
Protein, ur: 100 mg/dL — AB
Specific Gravity, Urine: 1.03 (ref 1.005–1.030)
pH: 5 (ref 5.0–8.0)

## 2019-08-24 LAB — LACTIC ACID, PLASMA: Lactic Acid, Venous: 5.1 mmol/L (ref 0.5–1.9)

## 2019-08-24 LAB — CBC
HCT: 24.8 % — ABNORMAL LOW (ref 36.0–46.0)
Hemoglobin: 8.5 g/dL — ABNORMAL LOW (ref 12.0–15.0)
MCH: 33.1 pg (ref 26.0–34.0)
MCHC: 34.3 g/dL (ref 30.0–36.0)
MCV: 96.5 fL (ref 80.0–100.0)
Platelets: 50 10*3/uL — ABNORMAL LOW (ref 150–400)
RBC: 2.57 MIL/uL — ABNORMAL LOW (ref 3.87–5.11)
RDW: 21.2 % — ABNORMAL HIGH (ref 11.5–15.5)
WBC: 6.5 10*3/uL (ref 4.0–10.5)
nRBC: 0.3 % — ABNORMAL HIGH (ref 0.0–0.2)

## 2019-08-24 LAB — GLUCOSE, CAPILLARY
Glucose-Capillary: 109 mg/dL — ABNORMAL HIGH (ref 70–99)
Glucose-Capillary: 121 mg/dL — ABNORMAL HIGH (ref 70–99)
Glucose-Capillary: 125 mg/dL — ABNORMAL HIGH (ref 70–99)
Glucose-Capillary: 128 mg/dL — ABNORMAL HIGH (ref 70–99)
Glucose-Capillary: 160 mg/dL — ABNORMAL HIGH (ref 70–99)
Glucose-Capillary: 185 mg/dL — ABNORMAL HIGH (ref 70–99)

## 2019-08-24 LAB — PROTIME-INR
INR: 3 — ABNORMAL HIGH (ref 0.8–1.2)
Prothrombin Time: 31 seconds — ABNORMAL HIGH (ref 11.4–15.2)

## 2019-08-24 LAB — TRIGLYCERIDES: Triglycerides: 147 mg/dL (ref <150)

## 2019-08-24 MED ORDER — NOREPINEPHRINE 16 MG/250ML-% IV SOLN
0.0000 ug/min | INTRAVENOUS | Status: DC
  Administered 2019-08-24: 40 ug/min via INTRAVENOUS
  Administered 2019-08-24: 30 ug/min via INTRAVENOUS
  Filled 2019-08-24 (×2): qty 250

## 2019-08-24 MED ORDER — PROPOFOL 1000 MG/100ML IV EMUL
0.0000 ug/kg/min | INTRAVENOUS | Status: DC
  Administered 2019-08-24: 15 ug/kg/min via INTRAVENOUS
  Administered 2019-08-24: 35 ug/kg/min via INTRAVENOUS
  Filled 2019-08-24 (×2): qty 100

## 2019-08-24 MED ORDER — SODIUM BICARBONATE 8.4 % IV SOLN
INTRAVENOUS | Status: AC
  Filled 2019-08-24: qty 50

## 2019-08-24 MED ORDER — FOLIC ACID 1 MG PO TABS
1.0000 mg | ORAL_TABLET | Freq: Every day | ORAL | Status: DC
  Administered 2019-08-24: 1 mg

## 2019-08-24 MED ORDER — MIDAZOLAM HCL 2 MG/2ML IJ SOLN
1.0000 mg | INTRAMUSCULAR | Status: DC | PRN
  Administered 2019-08-24 (×2): 2 mg via INTRAVENOUS
  Filled 2019-08-24 (×2): qty 2

## 2019-08-24 MED ORDER — PHENYLEPHRINE HCL-NACL 10-0.9 MG/250ML-% IV SOLN
0.0000 ug/min | INTRAVENOUS | Status: DC
  Administered 2019-08-24: 400 ug/min via INTRAVENOUS
  Administered 2019-08-24: 20 ug/min via INTRAVENOUS
  Administered 2019-08-24 (×4): 400 ug/min via INTRAVENOUS
  Filled 2019-08-24 (×6): qty 250

## 2019-08-24 MED ORDER — VECURONIUM BROMIDE 10 MG IV SOLR
INTRAVENOUS | Status: AC
  Administered 2019-08-24: 10 mg via INTRAVENOUS
  Filled 2019-08-24: qty 10

## 2019-08-24 MED ORDER — MORPHINE SULFATE (PF) 2 MG/ML IV SOLN: 2.0000 mg | INTRAVENOUS | Status: DC | PRN

## 2019-08-24 MED ORDER — VASOPRESSIN 20 UNIT/ML IV SOLN
0.0300 [IU]/min | INTRAVENOUS | Status: DC
  Administered 2019-08-24: 0.03 [IU]/min via INTRAVENOUS
  Filled 2019-08-24: qty 2

## 2019-08-24 MED ORDER — FENTANYL BOLUS VIA INFUSION
25.0000 ug | INTRAVENOUS | Status: DC | PRN
  Administered 2019-08-24 (×2): 100 ug via INTRAVENOUS
  Filled 2019-08-24: qty 100

## 2019-08-24 MED ORDER — ADULT MULTIVITAMIN LIQUID CH
15.0000 mL | Freq: Every day | ORAL | Status: DC
  Administered 2019-08-24: 15 mL via ORAL
  Filled 2019-08-24: qty 15

## 2019-08-24 MED ORDER — SODIUM CHLORIDE 0.9% IV SOLUTION: Freq: Once | INTRAVENOUS | Status: AC

## 2019-08-24 MED ORDER — GLYCOPYRROLATE 0.2 MG/ML IJ SOLN: 0.2000 mg | INTRAMUSCULAR | Status: DC | PRN

## 2019-08-24 MED ORDER — ACETAMINOPHEN 650 MG RE SUPP: 650.0000 mg | Freq: Four times a day (QID) | RECTAL | Status: DC | PRN

## 2019-08-24 MED ORDER — SODIUM BICARBONATE 8.4 % IV SOLN
INTRAVENOUS | Status: DC
  Filled 2019-08-24 (×3): qty 150

## 2019-08-24 MED ORDER — FREE WATER
200.0000 mL | Status: DC
  Administered 2019-08-24 (×2): 200 mL

## 2019-08-24 MED ORDER — "THROMBI-PAD 3""X3"" EX PADS"
1.0000 | MEDICATED_PAD | Freq: Once | CUTANEOUS | Status: DC
  Filled 2019-08-24: qty 1

## 2019-08-24 MED ORDER — SODIUM BICARBONATE-DEXTROSE 150-5 MEQ/L-% IV SOLN
150.0000 meq | INTRAVENOUS | Status: DC
  Filled 2019-08-24: qty 1000

## 2019-08-24 MED ORDER — MORPHINE BOLUS VIA INFUSION
5.0000 mg | INTRAVENOUS | Status: DC | PRN
  Filled 2019-08-24: qty 5

## 2019-08-24 MED ORDER — SODIUM BICARBONATE 8.4 % IV SOLN
50.0000 meq | Freq: Once | INTRAVENOUS | Status: AC
  Administered 2019-08-24: 50 meq via INTRAVENOUS

## 2019-08-24 MED ORDER — POLYVINYL ALCOHOL 1.4 % OP SOLN
1.0000 [drp] | Freq: Four times a day (QID) | OPHTHALMIC | Status: DC | PRN
  Filled 2019-08-24: qty 15

## 2019-08-24 MED ORDER — LORAZEPAM 2 MG/ML IJ SOLN: 2.0000 mg | INTRAMUSCULAR | Status: DC | PRN

## 2019-08-24 MED ORDER — MORPHINE 100MG IN NS 100ML (1MG/ML) PREMIX INFUSION
0.0000 mg/h | INTRAVENOUS | Status: DC
  Administered 2019-08-24: 10 mg/h via INTRAVENOUS
  Filled 2019-08-24: qty 100

## 2019-08-24 MED ORDER — GLYCOPYRROLATE 1 MG PO TABS: 1.0000 mg | ORAL_TABLET | ORAL | Status: DC | PRN

## 2019-08-24 MED ORDER — FENTANYL 2500MCG IN NS 250ML (10MCG/ML) PREMIX INFUSION
0.0000 ug/h | INTRAVENOUS | Status: DC
  Administered 2019-08-24: 100 ug/h via INTRAVENOUS
  Administered 2019-08-24: 200 ug/h via INTRAVENOUS
  Filled 2019-08-24 (×2): qty 250

## 2019-08-24 MED ORDER — THIAMINE HCL 100 MG PO TABS
100.0000 mg | ORAL_TABLET | Freq: Every day | ORAL | Status: DC
  Administered 2019-08-24: 100 mg
  Filled 2019-08-24: qty 1

## 2019-08-24 MED ORDER — VECURONIUM BROMIDE 10 MG IV SOLR: 10.0000 mg | Freq: Once | INTRAVENOUS | Status: DC

## 2019-08-24 MED ORDER — PHENYLEPHRINE CONCENTRATED 100MG/250ML (0.4 MG/ML) INFUSION SIMPLE
0.0000 ug/min | INTRAVENOUS | Status: DC
  Filled 2019-08-24: qty 250

## 2019-08-24 MED ORDER — ACETAMINOPHEN 160 MG/5ML PO SOLN
650.0000 mg | Freq: Once | ORAL | Status: AC
  Administered 2019-08-24: 650 mg
  Filled 2019-08-24: qty 20.3

## 2019-08-24 MED ORDER — ACETAMINOPHEN 325 MG PO TABS: 650.0000 mg | ORAL_TABLET | Freq: Four times a day (QID) | ORAL | Status: DC | PRN

## 2019-08-24 MED ORDER — DIPHENHYDRAMINE HCL 50 MG/ML IJ SOLN: 25.0000 mg | INTRAMUSCULAR | Status: DC | PRN

## 2019-08-24 MED ORDER — SODIUM CHLORIDE 0.9 % IV SOLN: 2.0000 g | INTRAVENOUS | Status: DC

## 2019-08-25 LAB — BPAM FFP
Blood Product Expiration Date: 202103282359
ISSUE DATE / TIME: 202103240942
Unit Type and Rh: 6200

## 2019-08-25 LAB — PREPARE FRESH FROZEN PLASMA: Unit division: 0

## 2019-08-27 LAB — CULTURE, RESPIRATORY W GRAM STAIN
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
## 2019-09-01 NOTE — Progress Notes (Signed)
Patient was on a morphine gtt per comfort care orders and protocol. 40mg /73ml Morphine wasted with 43m, RN.

## 2019-09-01 NOTE — Progress Notes (Signed)
Pharmacy Antibiotic Note  Victoria Peck is a 54 y.o. female admitted on 01-Sep-2019 with aspiration pneumonia.  Pharmacy has been consulted for cefepime dosing.  Patient with active GIB from cirrhosis and INR 2.9 now s/p bronchoscopy with bloody aspiration in airways. No active bleeding in airways.  Patient was on ceftriaxone now broadened to cefepime.  Renal function is worsening again, Tmax 100.2, WBC normalized.  Plan: Change cefepime to 2gm IV Q24H Monitor renal fxn, clinical progress  Height: 5\' 4"  (162.6 cm) Weight: 156 lb 1.4 oz (70.8 kg) IBW/kg (Calculated) : 54.7  Temp (24hrs), Avg:99.3 F (37.4 C), Min:98.5 F (36.9 C), Max:100.2 F (37.9 C)  Recent Labs  Lab 08/20/19 0926 08/20/19 0926 08/21/19 0338 08/21/19 0338 08/21/19 2052 08/22/19 0338 08/23/19 0226 08/23/19 1305 08/10/2019 0420  WBC 9.0   < > 9.9   < > 12.7* 12.7* 13.3* 14.5* 6.5  CREATININE 0.99  --  0.95  --   --  0.97 1.46*  --  2.14*   < > = values in this interval not displayed.    Estimated Creatinine Clearance: 29.3 mL/min (A) (by C-G formula based on SCr of 2.14 mg/dL (H)).    No Known Allergies  CTX 3/18 >> 3/21 Cefepime 3/21 >>  3/18 MRSA PCR / covid / flu - negative  Rutilio Yellowhair D. 4/18, PharmD, BCPS, BCCCP 08/31/2019, 8:23 AM

## 2019-09-01 NOTE — Procedures (Signed)
Extubation Procedure Note  Patient Details:   Name: Threasa Kinch DOB: 07/12/1965 MRN: [id number]   Airway Documentation:  Airway 7.5 mm (Active)  Secured at (cm) 23 cm 16-Sep-2019 1615  Measured From Lips 09-16-2019 1615  Secured Location Center Sep 16, 2019 1615  Secured By [employer] 09/16/2019 1615  Tube Holder Repositioned Yes 09-16-2019 1615  Cuff Pressure (cm H2O) 26 cm H2O 16-Sep-2019 0748  Site Condition Dry 09/16/19 1615   Vent end date: (not recorded) Vent end time: (not recorded)   Evaluation  O2 sats: transiently fell during during procedure Complications: No apparent complications Patient did tolerate procedure well. Bilateral Breath Sounds: Diminished, Rhonchi    Patient terminally extubated per MD order.  Clent Ridges 09-16-19, 9:00 PM

## 2019-09-01 NOTE — Procedures (Signed)
Central Venous Catheter Insertion Procedure Note Victoria Peck [id number] [birth year]  Procedure: Insertion of Central Venous Catheter Indications: Assessment of intravascular volume, Drug and/or fluid administration and Frequent blood sampling   FFP transfusing for coagulopathy   Procedure Details Consent: Risks of procedure as well as the alternatives and risks of each were explained to the (patient/caregiver).  Consent for procedure obtained. Time Out: Verified patient identification, verified procedure, site/side was marked, verified correct patient position, special equipment/implants available, medications/allergies/relevent history reviewed, required imaging and test results available.  Performed  Maximum sterile technique was used including antiseptics, cap, gloves, gown, hand hygiene, mask and sheet. Skin prep: Chlorhexidine; local anesthetic administered 3 ml Lido 1% A antimicrobial bonded/coated triple lumen catheter was placed in the right internal jugular vein using the Seldinger technique to 15 cm.  Line sutured.  Biopatch and sterile dressing applied.  Slight oozing noted at site, currently controlled/ not requiring any further action.  Will watch closely.   Evaluation Blood flow good Complications: No apparent complications Patient did tolerate procedure well. Chest X-ray ordered to verify placement.  CXR: pending.  Procedure performed with ultrasound guidance for real time vessel cannulation.      [name], MSN, AGACNP-BC [facility] 08/21/2019, 11:36 AM

## 2019-09-01 NOTE — Accreditation Note (Signed)
o Restraints not reported to CMS Pursuant to regulation 482.13 (G) (3) use of soft wrist restraints was logged 

## 2019-09-01 NOTE — Progress Notes (Signed)
eLink Physician-Brief Progress Note Patient Name: Victoria Peck DOB: 07/17/1965 MRN: [id number]   Date of Service  2019/09/23  HPI/Events of Note  Pt with persistent ventilator dyssynchrony with desaturation.  eICU Interventions  Fentanyl infusion + PRN versed.        Victoria Peck Victoria Peck 09-23-19, 3:05 AM

## 2019-09-01 NOTE — Progress Notes (Signed)
PCCM Interval Note  I spoke with the patient's mother and husband at bedside.  Explained her decline over the last 24 hours, or failure to achieve stability despite all aggressive interventions.  I do not believe she will survive this illness.  Her prognosis remains the same whether we initiated CVVH or any other aggressive measures.  For this reason I would defer.  I explained this to family.  They are working on getting other family members here to the hospital to visit.  We will continue her current level of support to try to achieve enough stability to allow visitors.  They have asked for chaplain and we will request their assistance.  If they decide they would like to transition to full comfort care then we will facilitate this.  In the meantime I recommended that we do not escalate care, change her CODE STATUS to DNR.  They are in agreement.  We will make these changes in the orders now.  Independent CC time 30 minutes  Levy Pupa, MD, PhD September 21, 2019, 4:53 PM [facility] [phone] or if no answer [phone]

## 2019-09-01 NOTE — Progress Notes (Signed)
  Patient becoming progressively hypotensive with increased pressors needs, vasopressin now added.  Has been on PCV with earlier noted to have large volumes however now is not getting adequate volumes.  ABG drawn and changed to [facility], however continues to have significant dyssynchrony on PRVC.  ABG confirms worsening respiratory acidosis/ ongoing metabolic acidosis.   P:  S/p 1 amp bicarb now Changed to ARDS protocol -6cc /kg Vecuronium x 1 given for MV synchrony, may need prn, possibly a nimbex gtt Recheck ABG in 1hr      [name], MSN, AGACNP-BC [facility] 09-23-2019, 2:39 PM

## 2019-09-01 NOTE — Procedures (Signed)
Arterial Catheter Insertion Procedure Note Victoria Peck [id number] 01/03/1966  Procedure: Insertion of Arterial Catheter  Indications: Blood pressure monitoring  Procedure Details Consent: Risks of procedure as well as the alternatives and risks of each were explained to the (patient/caregiver).  Consent for procedure obtained. Time Out: Verified patient identification, verified procedure, site/side was marked, verified correct patient position, special equipment/implants available, medications/allergies/relevent history reviewed, required imaging and test results available.  Performed  Maximum sterile technique was used including antiseptics, cap, gloves, gown, hand hygiene, mask and sheet. Skin prep: Chlorhexidine; local anesthetic administered 20 gauge catheter was inserted into left radial artery using the Seldinger technique. ULTRASOUND GUIDANCE USED: NO Evaluation Blood flow good; BP tracing good. Complications: No apparent complications.   Victoria Peck [facility] 08/02/2019

## 2019-09-01 NOTE — Progress Notes (Signed)
Subjective: Intubated. No further bleeding.  Objective: Vital signs in last 24 hours: Temp:  [98.5 F (36.9 C)-102 F (38.9 C)] 102 F (38.9 C) (03/24 1003) Pulse Rate:  [99-132] 101 (03/24 1109) Resp:  [15-27] 24 (03/24 1109) BP: (87-165)/(34-92) 165/92 (03/24 1109) SpO2:  [79 %-96 %] 93 % (03/24 1109) Arterial Line BP: (89-93)/(31-32) 89/32 (03/24 1003) FiO2 (%):  [50 %-100 %] 100 % (03/24 1109) Weight:  [70.8 kg] 70.8 kg (03/24 0400) Weight change: 2.5 kg Last BM Date: 08/23/19  PE: GEN:  Intubated, not responsive HEENT:  Jaundiced.  Lab Results: CBC    Component Value Date/Time   WBC 6.5 09/02/2019 0420   RBC 2.57 (L) 09-02-2019 0420   HGB 7.1 (L) 2019-09-02 0556   HCT 21.0 (L) 09/02/19 0556   PLT 50 (L) Sep 02, 2019 0420   MCV 96.5 September 02, 2019 0420   MCH 33.1 09-02-2019 0420   MCHC 34.3 Sep 02, 2019 0420   RDW 21.2 (H) 09-02-2019 0420   CMP     Component Value Date/Time   NA 150 (H) 09-02-2019 0556   K 3.9 02-Sep-2019 0556   CL 125 (H) September 02, 2019 0420   CO2 12 (L) 09-02-19 0420   GLUCOSE 138 (H) 09-02-2019 0420   BUN 65 (H) Sep 02, 2019 0420   CREATININE 2.14 (H) Sep 02, 2019 0420   CALCIUM 8.1 (L) 09/02/2019 0420   PROT 4.6 (L) 2019-09-02 0420   ALBUMIN 2.1 (L) Sep 02, 2019 0420   AST 69 (H) Sep 02, 2019 0420   ALT 44 09/02/2019 0420   ALKPHOS 101 2019-09-02 0420   BILITOT 6.9 (H) 09/02/2019 0420   GFRNONAA 26 (L) 09/02/2019 0420   GFRAA 30 (L) 09/02/2019 0420   Assessment:  1. Alcoholic cirrhosis, decompensated (ascites, encephalopathy, coagulopathy). 2. Anemia.  Portal gastropathy.  No ongoing rampant GI bleeding. 3. Alcohol abuse. 4. Hematemesis x 1. Not suggestive of variceal bleeding. 5. Hepatic encephalopathy. 6. Ascites. 7.  Aspiration event; intubated 8. Acute renal failure. HRS? 9.  Worsening hypotension, requiring pressor support.  Plan:  1.  I had lengthy family meeting with patient's husband and mother.  I explained at length  why their family member is not a liver transplant candidate at the present time. 2.  I advised ongoing medical management, and that how patient does during the next 48-72 hours will be very telling regarding the patient's likelihood (or not) of improvement and survival. 3.  I have reached out to [place], FNP of PCCM team, to inquire whether patient could have her two sons visit her, given patient's critical status. 4.  Prognosis guarded. 5.  [facility] will follow.   Freddy Jaksch 2019/09/02, 11:32 AM   Cell [phone] If no answer or after 5 PM call [phone]

## 2019-09-01 NOTE — Progress Notes (Signed)
21:05- Patient asystole on monitor in two leads. Patient is a DNR, comfort care measures only.   No spontaneous respirations. No heart tones auscultated, no peripheral pulses palpated  for two minutes, pupils fixed and dilated.  Patients husband and mother at bedside and tearful. Emotional support provided.   Patient pronounced by Judy Pimple, RN and Floy Sabina, RN. CDS notified of time of death. E-Link Physician, Dr. Warrick Parisian also made aware of cardiac time of death.

## 2019-09-01 NOTE — Progress Notes (Signed)
eLink Physician-Brief Progress Note Patient Name: Victoria Peck DOB: 1965-08-04 MRN: [id number]   Date of Service  08/06/2019  HPI/Events of Note  Pt with a significant metabolic acidosis with respiratory compensation.  eICU Interventions  Will start a limited bicarb infusion. Also a.m. CXR ordered.        Thomasene Lot [name] 08/07/2019, 3:31 AM

## 2019-09-01 NOTE — Progress Notes (Signed)
Peep increased to 12 per Elink. Repeat ABG I one hour.

## 2019-09-01 NOTE — Progress Notes (Signed)
Patients diamond ring retrieved from hospital Security office by secretary Vidal Schwalbe. Diamond ring placed in the hands of patients husband. Patients husband extremely thankful for safe keeping of patients personal belongings.

## 2019-09-01 DEATH — deceased

## 2019-09-12 DIAGNOSIS — E872 Acidosis, unspecified: Secondary | ICD-10-CM | POA: Diagnosis not present

## 2019-09-12 DIAGNOSIS — G92 Toxic encephalopathy: Secondary | ICD-10-CM | POA: Diagnosis not present

## 2019-09-12 DIAGNOSIS — D500 Iron deficiency anemia secondary to blood loss (chronic): Secondary | ICD-10-CM | POA: Diagnosis present

## 2019-09-12 DIAGNOSIS — K7030 Alcoholic cirrhosis of liver without ascites: Secondary | ICD-10-CM | POA: Diagnosis present

## 2019-09-12 DIAGNOSIS — K7682 Hepatic encephalopathy: Secondary | ICD-10-CM | POA: Diagnosis present

## 2019-09-12 DIAGNOSIS — J690 Pneumonitis due to inhalation of food and vomit: Secondary | ICD-10-CM | POA: Diagnosis not present

## 2019-09-12 DIAGNOSIS — D689 Coagulation defect, unspecified: Secondary | ICD-10-CM | POA: Diagnosis not present

## 2019-09-12 DIAGNOSIS — K7200 Acute and subacute hepatic failure without coma: Secondary | ICD-10-CM | POA: Diagnosis present

## 2019-09-12 DIAGNOSIS — J80 Acute respiratory distress syndrome: Secondary | ICD-10-CM | POA: Diagnosis not present

## 2019-09-12 DIAGNOSIS — K7290 Hepatic failure, unspecified without coma: Secondary | ICD-10-CM | POA: Diagnosis present

## 2019-09-12 DIAGNOSIS — K3189 Other diseases of stomach and duodenum: Secondary | ICD-10-CM | POA: Diagnosis present

## 2019-09-12 DIAGNOSIS — G928 Other toxic encephalopathy: Secondary | ICD-10-CM | POA: Diagnosis not present

## 2019-10-01 NOTE — Death Summary Note (Signed)
DEATH SUMMARY   Patient Details  Name: Victoria Peck MRN: [id number] DOB: 1965-06-19  Admission/Discharge Information   Admit Date:  2019/09/10  Date of Death: Date of Death: September 16, 2019  Time of Death: Time of Death: 10-10-03  Length of Stay: 6  Referring Physician: Ileana Ladd, MD   Reason(s) for Hospitalization  Hematemesis, acute blood loss anemia  Diagnoses  Preliminary cause of death:   Multifactorial Shock due to hepatic failure and to septic shock (not present on admission)  Secondary Diagnoses (including complications and co-morbidities):  Principal Problem:   Shock (HCC) Active Problems:   Gastrointestinal hemorrhage with hematemesis   Acute renal failure (ARF) (HCC)   Aspiration into respiratory tract   History of ETT   Hypoxemia   Acute respiratory failure (HCC)   Blood loss anemia   Acute hepatic failure   Lactic acidosis   ARDS (adult respiratory distress syndrome) (HCC)   Aspiration pneumonitis (HCC)   Coagulopathy (HCC)   Toxic metabolic encephalopathy   Hepatic encephalopathy [facility])   Brief Hospital Course (including significant findings, care, treatment, and services provided and events leading to death)  Victoria Peck was a 54 year old woman with alcoholic cirrhosis, continued alcohol use, portal gastropathy, admitted with decompensated cirrhosis and associated acute blood loss anemia following hematemesis.  The bleeding stopped and she had some hemodynamic stabilization, improvement in mental status.  Unfortunately decompensated over the next few days with progressive encephalopathy, hypoxemia, pulmonary infiltrates consistent with aspiration pneumonitis/pneumonia and acute lung injury.  She was intubated 3/21, bronchoscopy with evidence of blood, presumably aspirated.  She had progressive decline, progressive pressor needs, evolving renal failure, continued coagulopathy without evidence of acute GI blood loss. FiO2 and PEEP needs increased to  1.00+12.  Bicarbonate drip initiated for profound metabolic acidosis.  Despite all aggressive interventions, up titration of pressors and the addition of vasopressin her blood pressure did not stabilize.  Discussions were undertaken with the patient and family given her multifactorial shock, profound lactic acidosis, evolving multiorgan failure that her prognosis for meaningful survival is very poor.  Based on this initiation of hemodialysis was not recommended.  They voiced understanding and wanted to ensure priority on the patient's comfort as well as the ability of other family members to visit.  She was transition to comfort care Sep 16, 2019 and died on the same day.    Pertinent Labs and Studies  Significant Diagnostic Studies CT ABDOMEN PELVIS WO CONTRAST  Result Date: 2019/09/10 CLINICAL DATA:  54 year old female with hypotensive shock. EXAM: CT CHEST, ABDOMEN AND PELVIS WITHOUT CONTRAST TECHNIQUE: Multidetector CT imaging of the chest, abdomen and pelvis was performed following the standard protocol without IV contrast. COMPARISON:  CT abdomen pelvis dated 06/19/2017. FINDINGS: Evaluation of this exam is limited in the absence of intravenous contrast. CT CHEST FINDINGS Cardiovascular: There is no cardiomegaly or pericardial effusion. There is hypoattenuation of the cardiac blood pool suggestive of a degree of anemia. Clinical correlation is recommended. The thoracic aorta is unremarkable. Evaluation of the vasculature is limited in the absence of contrast. There is cephalization of the pulmonary vasculature, likely mild congestion. Mediastinum/Nodes: No definite hilar adenopathy. The esophagus is grossly unremarkable. No large mediastinal fluid collection. Small amount of fluid anterior to the aortic arch, likely related to pericardial recess. Evaluation is limited in the absence of contrast. Lungs/Pleura: Bilateral upper lobe clusters of ground-glass opacities most consistent with pneumonia. Clinical  correlation recommended. There is no pleural effusion or pneumothorax. The central airways are patent. Musculoskeletal:  Degenerative changes of the spine. No acute osseous pathology. CT ABDOMEN PELVIS FINDINGS No intra-abdominal free air. There is a small ascites, increased since the prior CT. Hepatobiliary: Cirrhosis. The gallbladder is distended. There are multiple stones within the gallbladder. Evaluation of the gallbladder is limited on this CT. Ultrasound may provide better evaluation if there is clinical concern for acute cholecystitis. Pancreas: The pancreas is grossly unremarkable as visualized. Spleen: Splenomegaly measuring 18 cm in craniocaudal length (previously 13 mm). Adrenals/Urinary Tract: The adrenal glands are unremarkable. There is no hydronephrosis or nephrolithiasis on either side. The urinary bladder is grossly unremarkable. Stomach/Bowel: There is diffuse thickening of the small bowel and colon likely related to hepatic enteropathy and ascites. Clinical correlation is recommended to exclude enterocolitis. No bowel obstruction. The appendix is normal. Vascular/Lymphatic: The abdominal aorta and IVC are unremarkable. No portal venous gas. No adenopathy. Reproductive: The uterus is anteverted and grossly unremarkable. Bilateral tubal ligation clips. Other: Mild diffuse subcutaneous edema. Musculoskeletal: Degenerative changes of the SI joints. No acute osseous pathology. IMPRESSION: 1. Bilateral upper lobe clusters of ground-glass opacities most consistent with pneumonia. Clinical correlation is recommended. 2. Decompensated cirrhosis with progression of ascites and splenomegaly. 3. Distended gallbladder with multiple gallstones. Ultrasound may provide better evaluation if there is clinical concern for acute cholecystitis. 4. Diffuse thickening of the small bowel and colon likely related to hepatic enteropathy and ascites. Clinical correlation is recommended to exclude enterocolitis. No bowel  obstruction. Normal appendix. 5. Findings of anemia. Electronically Signed   By: Elgie Collard M.D.   On: 08/31/2019 01:53   DG Abd 1 View  Result Date: 08/21/2019 CLINICAL DATA:  Encounter for OG tube placement. Patient was declining-no repeat performed due to OG tube visualized on KUB. EXAM: ABDOMEN - 1 VIEW COMPARISON:  Abdominal radiograph 08/21/2019 FINDINGS: The enteric tube courses below the diaphragm and is looped over the central abdomen likely within the stomach. The bowel gas pattern is nonobstructive. No supine evidence for free air. IMPRESSION: Enteric tube distal aspect looped in the central abdomen likely within the stomach. Electronically Signed   By: Emmaline Kluver M.D.   On: 08/21/2019 16:57   DG Abd 1 View  Result Date: 08/21/2019 CLINICAL DATA:  Feeding tube placement. EXAM: ABDOMEN - 1 VIEW COMPARISON:  August 20, 2019. FINDINGS: The bowel gas pattern is normal. Nasogastric tube tip is seen in expected position of distal stomach. No radio-opaque calculi or other significant radiographic abnormality are seen. IMPRESSION: Nasogastric tube tip seen in expected position of distal stomach. Electronically Signed   By: Lupita Raider M.D.   On: 08/21/2019 15:37   DG Abd 1 View  Result Date: 08/20/2019 CLINICAL DATA:  Evaluate NG tube EXAM: ABDOMEN - 1 VIEW COMPARISON:  None. FINDINGS: The NG tube terminates in left upper quadrant, within the stomach. IMPRESSION: Appropriate NG tube placement. Electronically Signed   By: Gerome Sam III M.D   On: 08/20/2019 11:20   CT Head Wo Contrast  Result Date: 08/15/2019 CLINICAL DATA:  54 year old female with dizziness and head trauma. EXAM: CT HEAD WITHOUT CONTRAST TECHNIQUE: Contiguous axial images were obtained from the base of the skull through the vertex without intravenous contrast. COMPARISON:  None. FINDINGS: Brain: The ventricles and sulci appropriate size for patient's age. The gray-white matter discrimination is preserved.  There is no acute intracranial hemorrhage. No mass effect or midline shift no extra-axial fluid collection. Vascular: No hyperdense vessel or unexpected calcification. Skull: Normal. Negative for fracture or focal lesion. Sinuses/Orbits:  The visualized paranasal sinuses and mastoid air cells are clear. There is a subcentimeter soft tissue nodule over the left eyelid. Clinical correlation is recommended. The globes and retro-orbital fat are preserved. Other: None IMPRESSION: 1. No acute intracranial pathology. 2. Subcentimeter soft tissue nodule over the left eyelid. Clinical correlation is recommended. Electronically Signed   By: Elgie Collard M.D.   On: 08/29/2019 01:39   CT CHEST WO CONTRAST  Result Date: 08/15/2019 CLINICAL DATA:  54 year old female with hypotensive shock. EXAM: CT CHEST, ABDOMEN AND PELVIS WITHOUT CONTRAST TECHNIQUE: Multidetector CT imaging of the chest, abdomen and pelvis was performed following the standard protocol without IV contrast. COMPARISON:  CT abdomen pelvis dated 06/19/2017. FINDINGS: Evaluation of this exam is limited in the absence of intravenous contrast. CT CHEST FINDINGS Cardiovascular: There is no cardiomegaly or pericardial effusion. There is hypoattenuation of the cardiac blood pool suggestive of a degree of anemia. Clinical correlation is recommended. The thoracic aorta is unremarkable. Evaluation of the vasculature is limited in the absence of contrast. There is cephalization of the pulmonary vasculature, likely mild congestion. Mediastinum/Nodes: No definite hilar adenopathy. The esophagus is grossly unremarkable. No large mediastinal fluid collection. Small amount of fluid anterior to the aortic arch, likely related to pericardial recess. Evaluation is limited in the absence of contrast. Lungs/Pleura: Bilateral upper lobe clusters of ground-glass opacities most consistent with pneumonia. Clinical correlation recommended. There is no pleural effusion or  pneumothorax. The central airways are patent. Musculoskeletal: Degenerative changes of the spine. No acute osseous pathology. CT ABDOMEN PELVIS FINDINGS No intra-abdominal free air. There is a small ascites, increased since the prior CT. Hepatobiliary: Cirrhosis. The gallbladder is distended. There are multiple stones within the gallbladder. Evaluation of the gallbladder is limited on this CT. Ultrasound may provide better evaluation if there is clinical concern for acute cholecystitis. Pancreas: The pancreas is grossly unremarkable as visualized. Spleen: Splenomegaly measuring 18 cm in craniocaudal length (previously 13 mm). Adrenals/Urinary Tract: The adrenal glands are unremarkable. There is no hydronephrosis or nephrolithiasis on either side. The urinary bladder is grossly unremarkable. Stomach/Bowel: There is diffuse thickening of the small bowel and colon likely related to hepatic enteropathy and ascites. Clinical correlation is recommended to exclude enterocolitis. No bowel obstruction. The appendix is normal. Vascular/Lymphatic: The abdominal aorta and IVC are unremarkable. No portal venous gas. No adenopathy. Reproductive: The uterus is anteverted and grossly unremarkable. Bilateral tubal ligation clips. Other: Mild diffuse subcutaneous edema. Musculoskeletal: Degenerative changes of the SI joints. No acute osseous pathology. IMPRESSION: 1. Bilateral upper lobe clusters of ground-glass opacities most consistent with pneumonia. Clinical correlation is recommended. 2. Decompensated cirrhosis with progression of ascites and splenomegaly. 3. Distended gallbladder with multiple gallstones. Ultrasound may provide better evaluation if there is clinical concern for acute cholecystitis. 4. Diffuse thickening of the small bowel and colon likely related to hepatic enteropathy and ascites. Clinical correlation is recommended to exclude enterocolitis. No bowel obstruction. Normal appendix. 5. Findings of anemia.  Electronically Signed   By: Elgie Collard M.D.   On: 08/16/2019 01:53   DG CHEST PORT 1 VIEW  Result Date: 2019-09-21 CLINICAL DATA:  Central line placement. EXAM: PORTABLE CHEST 1 VIEW COMPARISON:  Radiograph earlier this day, additional radiographs reviewed. Chest CT 08/15/2019 FINDINGS: New right internal jugular central venous catheter tip in the lower SVC. No visualized pneumothorax. Endotracheal tube tip 3.2 cm from the carina. Enteric tube tip below the diaphragm not included in the field of view. Fine reticular opacities diffusely throughout both lungs  with more patchy consolidation at the lung bases. Overall improved lung aeration from earlier today no significant pleural effusion. No acute osseous abnormalities are seen. IMPRESSION: 1. Tip of the right internal jugular central venous catheter in the lower SVC. No visualized pneumothorax. 2. Improved lung aeration from earlier today with persistent fine reticular opacities throughout both lungs and more patchy consolidation at the lung bases. Suspect improving pulmonary edema. Basilar opacities may be atelectasis or pneumonia. Electronically Signed   By: Keith Rake M.D.   On: 08/11/2019 12:06   DG Chest Port 1 View  Result Date: 08/17/2019 CLINICAL DATA:  Acute respiratory failure EXAM: PORTABLE CHEST 1 VIEW COMPARISON:  None. FINDINGS: The heart size and mediastinal contours are unchanged. ET tube is 1 cm above the carina. NG tube is seen below the diaphragm. There is interval significant improvement in the patchy airspace opacities throughout both lungs. No pleural effusion. No acute osseous abnormality. IMPRESSION: Interval significant improvement of the multifocal patchy airspace opacities. Electronically Signed   By: Prudencio Pair M.D.   On: 08/02/2019 06:06   DG CHEST PORT 1 VIEW  Result Date: 08/22/2019 CLINICAL DATA:  Worsening breath sounds on the right with hypoxia, initial encounter EXAM: PORTABLE CHEST 1 VIEW COMPARISON:   08/21/2019 FINDINGS: Endotracheal tube and gastric catheter are again seen and stable. Cardiac shadow is stable. Increasing opacity is noted within the right lung particularly in the right lung base. Stable opacities are noted within the left lung. No pneumothorax is seen. No bony abnormality is noted. IMPRESSION: Increasing parenchymal opacities within the right lung. Electronically Signed   By: Inez Catalina M.D.   On: 08/22/2019 09:52   DG CHEST PORT 1 VIEW  Result Date: 08/21/2019 CLINICAL DATA:  ENCOUNTER FOR INTUBATION TUBE ADJUSTMENT EXAM: PORTABLE CHEST 1 VIEW COMPARISON:  Chest radiograph 08/21/2019 FINDINGS: Interval retraction of the ET tube with tip projecting approximately 2.5 cm above the carina. Nasogastric tube remains in place. Stable cardiomediastinal contours. There is improved aeration of the left lung. There are persistent diffuse bilateral pulmonary opacities most pronounced in the right upper lung. No definite large effusion. IMPRESSION: 1. Interval retraction of the ET tube with tip projecting approximately 2.5 cm above the carina. 2. Improved aeration of the left lung. 3. Persistent diffuse bilateral pulmonary opacities. Electronically Signed   By: Audie Pinto M.D.   On: 08/21/2019 18:06   DG CHEST PORT 1 VIEW  Result Date: 08/21/2019 CLINICAL DATA:  Intubation. EXAM: PORTABLE CHEST 1 VIEW COMPARISON:  08/21/2019 FINDINGS: Endotracheal tube is in place, tip in the RIGHT mainstem bronchus. Consider withdrawing into the LOWER trachea by approximately 4 centimeters. Nasogastric tube has been placed, tip beyond the gastroesophageal junction off the image. There is new complete opacification of the LEFT hemithorax with few air bronchograms noted centrally. There is persistent significant opacity in the RIGHT UPPER lobe. IMPRESSION: 1. Endotracheal tube tip in the RIGHT mainstem bronchus. Consider withdrawing 4 centimeters. 2. New complete opacification of the LEFT hemithorax.  Critical Value/emergent results were called by telephone at the time of interpretation on 08/21/2019 at 5:07 pm to provider Dr. Loanne Drilling , who verbally acknowledged these results. Electronically Signed   By: Nolon Nations M.D.   On: 08/21/2019 17:07   DG CHEST PORT 1 VIEW  Result Date: 08/21/2019 CLINICAL DATA:  Aspiration into respiratory tract. EXAM: PORTABLE CHEST 1 VIEW COMPARISON:  Chest CT 2019-09-06 FINDINGS: Heart size at the upper limits of normal. There is extensive patchy airspace opacity throughout both  lungs significantly progressed as compared to chest CT February 06, 2020. No definite pleural effusion. No evidence of pneumothorax. No acute bony abnormality. IMPRESSION: Extensive patchy airspace opacities throughout both lungs significantly progressed from chest CT February 06, 2020. Findings are nonspecific but likely reflect sequela of aspiration given the provided history. Heart size at the upper limits of normal. Electronically Signed   By: Jackey LogeKyle  Golden DO   On: 08/21/2019 09:01    Procedures/Operations  3/21 ETT 3/21 bronchoscopy 3/24 CVC placement   Les Pouobert S Charmelle Soh 09/12/2019, 4:39 PM

## 2021-12-14 IMAGING — DX DG CHEST 1V PORT
1 series · 1 of 1 positions shown · non-contrast
Comparison: Radiograph earlier this day, additional radiographs
reviewed. Chest CT 08/18/2019

CLINICAL DATA: Central line placement.

EXAM:
PORTABLE CHEST 1 VIEW

[chest]
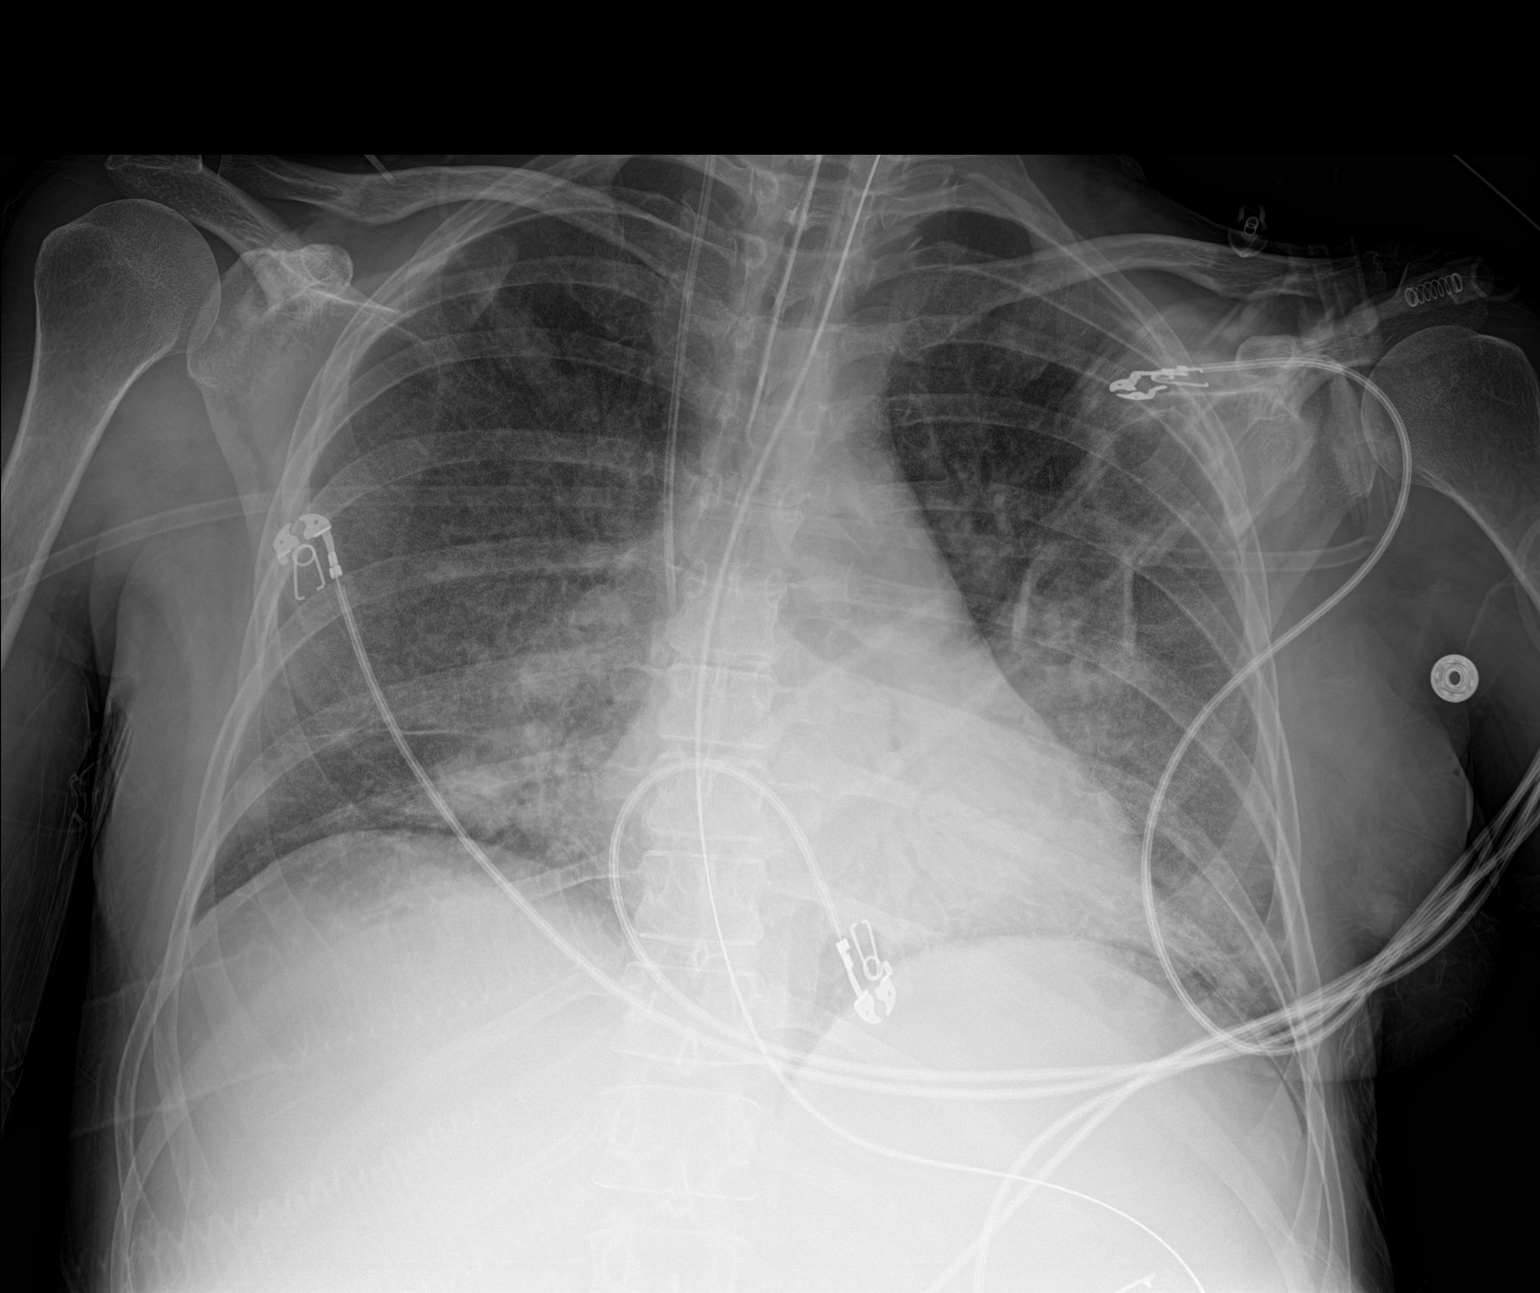

[1 of 1 positions shown; findings below may reference images not displayed]

FINDINGS: New right internal jugular central venous catheter tip in the lower
SVC. No visualized pneumothorax. Endotracheal tube tip 3.2 cm from
the carina. Enteric tube tip below the diaphragm not included in the
field of view. Fine reticular opacities diffusely throughout both
lungs with more patchy consolidation at the lung bases. Overall
improved lung aeration from earlier today no significant pleural
effusion. No acute osseous abnormalities are seen.
IMPRESSION: 1. Tip of the right internal jugular central venous catheter in the
lower SVC. No visualized pneumothorax.
2. Improved lung aeration from earlier today with persistent fine
reticular opacities throughout both lungs and more patchy
consolidation at the lung bases. Suspect improving pulmonary edema.
Basilar opacities may be atelectasis or pneumonia.
